# Patient Record
Sex: Male | Born: 1990 | Race: Black or African American | Hispanic: No | Smoking: Never smoker
Health system: Southern US, Community
[De-identification: ages and names within clinical notes are randomized; demographics above are authoritative.]

## PROBLEM LIST (undated history)

## (undated) DIAGNOSIS — I10 Essential (primary) hypertension: Secondary | ICD-10-CM

## (undated) DIAGNOSIS — E119 Type 2 diabetes mellitus without complications: Secondary | ICD-10-CM

## (undated) DIAGNOSIS — H269 Unspecified cataract: Secondary | ICD-10-CM

## (undated) HISTORY — DX: Unspecified cataract: H26.9

## (undated) HISTORY — PX: TONSILLECTOMY: SUR1361

---

## 2014-06-27 ENCOUNTER — Emergency Department (HOSPITAL_COMMUNITY)
Admission: EM | Admit: 2014-06-27 | Discharge: 2014-06-27 | Disposition: A | Discharge status: Home / Self Care | Payer: BLUE CROSS/BLUE SHIELD | Attending: Emergency Medicine | Admitting: Emergency Medicine

## 2014-06-27 ENCOUNTER — Encounter (HOSPITAL_COMMUNITY): Payer: Self-pay | Admitting: *Deleted

## 2014-06-27 DIAGNOSIS — I10 Essential (primary) hypertension: Secondary | ICD-10-CM | POA: Insufficient documentation

## 2014-06-27 DIAGNOSIS — L02412 Cutaneous abscess of left axilla: Secondary | ICD-10-CM | POA: Insufficient documentation

## 2014-06-27 DIAGNOSIS — L0291 Cutaneous abscess, unspecified: Secondary | ICD-10-CM

## 2014-06-27 DIAGNOSIS — E119 Type 2 diabetes mellitus without complications: Secondary | ICD-10-CM | POA: Insufficient documentation

## 2014-06-27 DIAGNOSIS — L0390 Cellulitis, unspecified: Secondary | ICD-10-CM

## 2014-06-27 DIAGNOSIS — L03112 Cellulitis of left axilla: Secondary | ICD-10-CM | POA: Insufficient documentation

## 2014-06-27 HISTORY — DX: Essential (primary) hypertension: I10

## 2014-06-27 HISTORY — DX: Type 2 diabetes mellitus without complications: E11.9

## 2014-06-27 MED ORDER — HYDROCODONE-ACETAMINOPHEN 5-325 MG PO TABS: 1.0000 | ORAL_TABLET | ORAL | Status: DC | PRN

## 2014-06-27 MED ORDER — HYDROCODONE-ACETAMINOPHEN 5-325 MG PO TABS
2.0000 | ORAL_TABLET | Freq: Once | ORAL | Status: AC
  Administered 2014-06-27: 2 via ORAL
  Filled 2014-06-27: qty 2

## 2014-06-27 MED ORDER — CEPHALEXIN 250 MG PO CAPS
500.0000 mg | ORAL_CAPSULE | Freq: Once | ORAL | Status: AC
  Administered 2014-06-27: 500 mg via ORAL
  Filled 2014-06-27: qty 2

## 2014-06-27 MED ORDER — LIDOCAINE HCL (PF) 1 % IJ SOLN
5.0000 mL | Freq: Once | INTRAMUSCULAR | Status: AC
  Administered 2014-06-27: 5 mL via INTRADERMAL
  Filled 2014-06-27: qty 5

## 2014-06-27 MED ORDER — CEPHALEXIN 500 MG PO CAPS: 500.0000 mg | ORAL_CAPSULE | Freq: Four times a day (QID) | ORAL | Status: DC

## 2014-06-27 NOTE — ED Notes (Signed)
The pt has a lesion under his lt arm since Sunday.  No temp .  It has been draining since saturday

## 2014-06-27 NOTE — ED Provider Notes (Signed)
CSN: [id number]     Arrival date & time 06/27/14  2119 History  This chart was scribed for non-physician practitioner, Emilia BeckKaitlyn Josef Tourigny, PA-C working with Tilden FossaElizabeth Rees, MD by Greggory StallionKayla Andersen, ED scribe. This patient was seen in room TR07C/TR07C and the patient's care was started at 9:50 PM.   Chief Complaint  Patient presents with  . Abscess   The history is provided by the patient. No language interpreter was used.    HPI Comments: Brent Hill is a 24 y.o. male who presents to the Emergency Department complaining of an abscess to his left axilla that started 3 days ago. He reports worsening aching without radiation pain and swelling but states it started draining yellow pus yesterday. Pt has not yet done anything for his symptoms. No aggravating/alleviating factors.   Past Medical History  Diagnosis Date  . Diabetes mellitus without complication   . Hypertension    History reviewed. No pertinent past surgical history. No family history on file. History  Substance Use Topics  . Smoking status: Never Smoker   . Smokeless tobacco: Not on file  . Alcohol Use: No    Review of Systems  Skin:       Abscess  All other systems reviewed and are negative.  Allergies  Review of patient's allergies indicates no known allergies.  Home Medications   Prior to Admission medications   Not on File   BP 168/94 mmHg  Pulse 109  Temp(Src) 98.3 F (36.8 C)  Resp 18  Ht 5\' 11"  (1.803 m)  Wt 367 lb (166.47 kg)  BMI 51.21 kg/m2  SpO2 100%   Physical Exam  Constitutional: He is oriented to person, place, and time. He appears well-developed and well-nourished. No distress.  HENT:  Head: Normocephalic and atraumatic.  Eyes: Conjunctivae and EOM are normal.  Neck: Neck supple. No tracheal deviation present.  Cardiovascular: Normal rate.   Pulmonary/Chest: Effort normal. No respiratory distress.  Abdominal: Soft. He exhibits no distension. There is no tenderness.  Musculoskeletal: Normal  range of motion.  Neurological: He is alert and oriented to person, place, and time.  Skin: Skin is warm and dry.  5 x 3 cm area of induration and tenderness of the left axilla. There is a small opening with minimal amount of purulent drainage noted.   Psychiatric: He has a normal mood and affect. His behavior is normal.  Nursing note and vitals reviewed.  ED Course  Procedures (including critical care time)  INCISION AND DRAINAGE Performed by: Emilia BeckKaitlyn Anaijah Augsburger, PA-C Consent: Verbal consent obtained. Risks and benefits: risks, benefits and alternatives were discussed Type: abscess  Body area: left axilla  Anesthesia: local infiltration  Incision was made with a scalpel.  Local anesthetic: lidocaine 1% without epinephrine  Anesthetic total: 2 ml  Complexity: complex Blunt dissection to break up loculations  Drainage: bloody  Drainage amount: minimal  Packing material: none  Patient tolerance: Patient tolerated the procedure well with no immediate complications.   DIAGNOSTIC STUDIES: Oxygen Saturation is 100% on RA, normal by my interpretation.    COORDINATION OF CARE: 9:53 PM-Will I&D abscess and discharge home with an antibiotic, pain medication, and warm compresses. Pt is agreeable to plan.   Labs Review Labs Reviewed - No data to display  Imaging Review No results found.   EKG Interpretation None      MDM   Final diagnoses:  Abscess and cellulitis    10:48 PM Patient's abscess attempted drainage but no purulent drainage was expressed. Patient  will be discharged with keflex and vicodin. Patient instructed to return with worsening or concerning symptoms. Patient advised to apply warm compresses.   I personally performed the services described in this documentation, which was scribed in my presence. The recorded information has been reviewed and is accurate.  Emilia Beck, PA-C 06/27/14 2251  Tilden Fossa, MD 06/27/14 [phone]

## 2014-06-27 NOTE — ED Notes (Signed)
The pt is  A diabetic

## 2014-06-27 NOTE — Discharge Instructions (Signed)
Take Vicodin as needed for pain. Take keflex as directed until gone. Return to the ED with worsening or concerning symptoms. Refer to attached documents for more information.

## 2015-09-19 ENCOUNTER — Encounter (HOSPITAL_COMMUNITY): Payer: Self-pay | Admitting: Emergency Medicine

## 2015-09-19 ENCOUNTER — Emergency Department (HOSPITAL_COMMUNITY): Payer: BLUE CROSS/BLUE SHIELD

## 2015-09-19 ENCOUNTER — Emergency Department (HOSPITAL_COMMUNITY)
Admission: EM | Admit: 2015-09-19 | Discharge: 2015-09-20 | Disposition: A | Discharge status: Home / Self Care | Payer: BLUE CROSS/BLUE SHIELD | Attending: Emergency Medicine | Admitting: Emergency Medicine

## 2015-09-19 DIAGNOSIS — K5900 Constipation, unspecified: Secondary | ICD-10-CM | POA: Diagnosis not present

## 2015-09-19 DIAGNOSIS — N200 Calculus of kidney: Secondary | ICD-10-CM | POA: Diagnosis not present

## 2015-09-19 DIAGNOSIS — N390 Urinary tract infection, site not specified: Secondary | ICD-10-CM

## 2015-09-19 DIAGNOSIS — E1165 Type 2 diabetes mellitus with hyperglycemia: Secondary | ICD-10-CM | POA: Insufficient documentation

## 2015-09-19 DIAGNOSIS — I10 Essential (primary) hypertension: Secondary | ICD-10-CM | POA: Diagnosis not present

## 2015-09-19 DIAGNOSIS — R739 Hyperglycemia, unspecified: Secondary | ICD-10-CM

## 2015-09-19 DIAGNOSIS — R1030 Lower abdominal pain, unspecified: Secondary | ICD-10-CM | POA: Diagnosis present

## 2015-09-19 LAB — CBC
HEMATOCRIT: 42.5 % (ref 39.0–52.0)
Hemoglobin: 14.8 g/dL (ref 13.0–17.0)
MCH: 28.6 pg (ref 26.0–34.0)
MCHC: 34.8 g/dL (ref 30.0–36.0)
MCV: 82 fL (ref 78.0–100.0)
PLATELETS: 224 10*3/uL (ref 150–400)
RBC: 5.18 MIL/uL (ref 4.22–5.81)
RDW: 12.9 % (ref 11.5–15.5)
WBC: 12 10*3/uL — ABNORMAL HIGH (ref 4.0–10.5)

## 2015-09-19 LAB — COMPREHENSIVE METABOLIC PANEL
ALT: 19 U/L (ref 17–63)
AST: 18 U/L (ref 15–41)
Albumin: 3.8 g/dL (ref 3.5–5.0)
Alkaline Phosphatase: 89 U/L (ref 38–126)
Anion gap: 15 (ref 5–15)
BUN: 9 mg/dL (ref 6–20)
CHLORIDE: 93 mmol/L — AB (ref 101–111)
CO2: 25 mmol/L (ref 22–32)
Calcium: 9.8 mg/dL (ref 8.9–10.3)
Creatinine, Ser: 0.89 mg/dL (ref 0.61–1.24)
GFR calc Af Amer: >60 mL/min (ref >60)
GFR calc non Af Amer: >60 mL/min (ref >60)
Glucose, Bld: 523 mg/dL — ABNORMAL HIGH (ref 65–99)
POTASSIUM: 4.2 mmol/L (ref 3.5–5.1)
SODIUM: 133 mmol/L — AB (ref 135–145)
Total Bilirubin: 1 mg/dL (ref 0.3–1.2)
Total Protein: 7.5 g/dL (ref 6.5–8.1)

## 2015-09-19 LAB — URINALYSIS, ROUTINE W REFLEX MICROSCOPIC
Bilirubin Urine: NEGATIVE
Ketones, ur: 40 mg/dL — AB
Nitrite: NEGATIVE
PH: 6 (ref 5.0–8.0)
Protein, ur: NEGATIVE mg/dL
Specific Gravity, Urine: 1.028 (ref 1.005–1.030)

## 2015-09-19 LAB — LIPASE, BLOOD: LIPASE: 55 U/L — AB (ref 11–51)

## 2015-09-19 LAB — URINE MICROSCOPIC-ADD ON

## 2015-09-19 MED ORDER — ONDANSETRON 4 MG PO TBDP
4.0000 mg | ORAL_TABLET | Freq: Once | ORAL | Status: AC | PRN
Start: 2015-09-19 — End: 2015-09-19
  Administered 2015-09-19: 4 mg via ORAL

## 2015-09-19 MED ORDER — TAMSULOSIN HCL 0.4 MG PO CAPS: 0.4000 mg | ORAL_CAPSULE | Freq: Every day | ORAL | Status: AC

## 2015-09-19 MED ORDER — SODIUM CHLORIDE 0.9 % IV BOLUS (SEPSIS)
1000.0000 mL | Freq: Once | INTRAVENOUS | Status: AC
  Administered 2015-09-19: 1000 mL via INTRAVENOUS

## 2015-09-19 MED ORDER — SODIUM CHLORIDE 0.9 % IV BOLUS (SEPSIS)
2000.0000 mL | Freq: Once | INTRAVENOUS | Status: AC
  Administered 2015-09-19: 2000 mL via INTRAVENOUS

## 2015-09-19 MED ORDER — IOPAMIDOL (ISOVUE-300) INJECTION 61%
100.0000 mL | Freq: Once | INTRAVENOUS | Status: AC | PRN
  Administered 2015-09-19: 100 mL via INTRAVENOUS

## 2015-09-19 MED ORDER — OXYCODONE-ACETAMINOPHEN 5-325 MG PO TABS: 1.0000 | ORAL_TABLET | ORAL | Status: DC | PRN

## 2015-09-19 MED ORDER — DEXTROSE 5 % IV SOLN
1.0000 g | Freq: Once | INTRAVENOUS | Status: AC
  Administered 2015-09-19: 1 g via INTRAVENOUS
  Filled 2015-09-19: qty 10

## 2015-09-19 MED ORDER — IBUPROFEN 600 MG PO TABS: 600.0000 mg | ORAL_TABLET | Freq: Four times a day (QID) | ORAL | Status: DC | PRN

## 2015-09-19 MED ORDER — MORPHINE SULFATE (PF) 4 MG/ML IV SOLN
4.0000 mg | Freq: Once | INTRAVENOUS | Status: AC
  Administered 2015-09-19: 4 mg via INTRAVENOUS
  Filled 2015-09-19: qty 1

## 2015-09-19 MED ORDER — ONDANSETRON 4 MG PO TBDP
ORAL_TABLET | ORAL | Status: AC
  Filled 2015-09-19: qty 1

## 2015-09-19 MED ORDER — KETOROLAC TROMETHAMINE 30 MG/ML IJ SOLN
30.0000 mg | Freq: Once | INTRAMUSCULAR | Status: AC
  Administered 2015-09-19: 30 mg via INTRAVENOUS
  Filled 2015-09-19: qty 1

## 2015-09-19 MED ORDER — ONDANSETRON HCL 4 MG PO TABS: 4.0000 mg | ORAL_TABLET | Freq: Three times a day (TID) | ORAL | Status: DC | PRN

## 2015-09-19 MED ORDER — INSULIN ASPART 100 UNIT/ML ~~LOC~~ SOLN
10.0000 [IU] | Freq: Once | SUBCUTANEOUS | Status: AC
  Administered 2015-09-19: 10 [IU] via INTRAVENOUS
  Filled 2015-09-19: qty 1

## 2015-09-19 MED ORDER — CEPHALEXIN 500 MG PO CAPS: 500.0000 mg | ORAL_CAPSULE | Freq: Two times a day (BID) | ORAL | Status: DC

## 2015-09-19 NOTE — ED Notes (Signed)
CBG 369. MD notified.

## 2015-09-19 NOTE — ED Notes (Signed)
Patient transported to CT 

## 2015-09-19 NOTE — ED Provider Notes (Signed)
CSN: [id number]     Arrival date & time 09/19/15  1552 History   First MD Initiated Contact with Patient 09/19/15 1856     Chief Complaint  Patient presents with  . Abdominal Pain     (Consider location/radiation/quality/duration/timing/severity/associated sxs/prior Treatment) HPI Patient presents with lower abdominal pain starting on Friday. States it gradually worsened Saturday. Associated with nausea and anorexia. Mild improvement on Sunday and then worsening again today. Worse with movement. States he's had multiple small bowel movements but no obvious blood. No previous abdominal surgery. Pain radiates to the groin on both sides but worse on the left. Denies testicular pain, scrotal swelling or penile discharge. No previously similar symptoms. Past Medical History  Diagnosis Date  . Diabetes mellitus without complication (HCC)   . Hypertension    Past Surgical History  Procedure Laterality Date  . Tonsillectomy     History reviewed. No pertinent family history. Social History  Substance Use Topics  . Smoking status: Never Smoker   . Smokeless tobacco: None  . Alcohol Use: No    Review of Systems  Constitutional: Negative for fever and chills.  Respiratory: Negative for shortness of breath.   Cardiovascular: Negative for chest pain and palpitations.  Gastrointestinal: Positive for nausea, vomiting, abdominal pain and constipation. Negative for diarrhea and blood in stool.  Genitourinary: Positive for flank pain. Negative for dysuria, frequency, hematuria, penile swelling, scrotal swelling, penile pain and testicular pain.  Musculoskeletal: Positive for myalgias and back pain. Negative for neck pain and neck stiffness.  Skin: Negative for rash and wound.  Neurological: Negative for dizziness, weakness, light-headedness, numbness and headaches.  All other systems reviewed and are negative.     Allergies  Review of patient's allergies indicates no known allergies.  Home  Medications   Prior to Admission medications   Medication Sig Start Date End Date Taking? Authorizing Provider  cephALEXin (KEFLEX) 500 MG capsule Take 1 capsule (500 mg total) by mouth 2 (two) times daily. 09/19/15   Loren Racer, MD  ibuprofen (ADVIL,MOTRIN) 600 MG tablet Take 1 tablet (600 mg total) by mouth every 6 (six) hours as needed. 09/19/15   Loren Racer, MD  ondansetron (ZOFRAN) 4 MG tablet Take 1 tablet (4 mg total) by mouth every 8 (eight) hours as needed for nausea or vomiting. 09/19/15   Loren Racer, MD  oxyCODONE-acetaminophen (PERCOCET) 5-325 MG tablet Take 1-2 tablets by mouth every 4 (four) hours as needed for severe pain. 09/19/15   Loren Racer, MD  tamsulosin (FLOMAX) 0.4 MG CAPS capsule Take 1 capsule (0.4 mg total) by mouth daily. 09/19/15   Loren Racer, MD   BP 129/71 mmHg  Pulse 93  Temp(Src) 98.2 F (36.8 C) (Oral)  Resp 16  Ht 5\' 11"  (1.803 m)  Wt 298 lb 3.2 oz (135.263 kg)  BMI 41.61 kg/m2  SpO2 97% Physical Exam  Constitutional: He is oriented to person, place, and time. He appears well-developed and well-nourished. No distress.  HENT:  Head: Normocephalic and atraumatic.  Mouth/Throat: Oropharynx is clear and moist.  Eyes: EOM are normal. Pupils are equal, round, and reactive to light.  Neck: Normal range of motion. Neck supple.  Cardiovascular: Normal rate and regular rhythm.   Pulmonary/Chest: Effort normal and breath sounds normal. No respiratory distress. He has no wheezes. He has no rales. He exhibits no tenderness.  Abdominal: Soft. Bowel sounds are normal. He exhibits no distension and no mass. There is tenderness (Mild diffuse abdominal tenderness but more focally tender in  the left lower quadrant. Questionable rebound.). There is rebound. There is no guarding.  Genitourinary: Penis normal.  No testicular or scrotal masses. Testicles with normal lie. No penal swelling or discharge.  Musculoskeletal: Normal range of motion. He exhibits  tenderness. He exhibits no edema.  Left flank tenderness with percussion. No midline thoracic or lumbar tenderness. No lower extremity swelling or asymmetry.  Neurological: He is alert and oriented to person, place, and time.  Skin: Skin is warm and dry. No rash noted. No erythema.  Psychiatric: He has a normal mood and affect. His behavior is normal.  Nursing note and vitals reviewed.   ED Course  Procedures (including critical care time) Labs Review Labs Reviewed  LIPASE, BLOOD - Abnormal; Notable for the following:    Lipase 55 (*)    All other components within normal limits  COMPREHENSIVE METABOLIC PANEL - Abnormal; Notable for the following:    Sodium 133 (*)    Chloride 93 (*)    Glucose, Bld 523 (*)    All other components within normal limits  CBC - Abnormal; Notable for the following:    WBC 12.0 (*)    All other components within normal limits  URINALYSIS, ROUTINE W REFLEX MICROSCOPIC (NOT AT [facility]) - Abnormal; Notable for the following:    Color, Urine STRAW (*)    Glucose, UA >1000 (*)    Hgb urine dipstick TRACE (*)    Ketones, ur 40 (*)    Leukocytes, UA SMALL (*)    All other components within normal limits  URINE MICROSCOPIC-ADD ON - Abnormal; Notable for the following:    Squamous Epithelial / LPF 0-5 (*)    Bacteria, UA RARE (*)    All other components within normal limits  CBG MONITORING, ED    Imaging Review Ct Abdomen Pelvis W Contrast  09/19/2015  CLINICAL DATA:  Lower abdominal pain, bilateral flank pain for 1 week. Nausea and vomiting. Left lower quadrant pain. EXAM: CT ABDOMEN AND PELVIS WITH CONTRAST TECHNIQUE: Multidetector CT imaging of the abdomen and pelvis was performed using the standard protocol following bolus administration of intravenous contrast. CONTRAST:  ISOVUE-300 IOPAMIDOL (ISOVUE-300) INJECTION 61% COMPARISON:  None. FINDINGS: Lung bases are clear. Diffuse fatty infiltration of the liver. No focal liver lesions identified. The  gallbladder, spleen, pancreas, adrenal glands, abdominal aorta, inferior vena cava, and retroperitoneal lymph nodes are unremarkable. Stomach and small bowel are decompressed. Scattered stool in the colon without abnormal distention. No free air or free fluid in the abdomen. The left renal nephrogram is slightly delayed in comparison to the right. There is mild stranding around the left kidney and ureter. No significant hydronephrosis or hydroureter but there is evidence of wall thickening in the ureter and stranding diffusely around the ureter, likely indicating pyelonephritis. There is a calcification at the location of the distal left ureter which is not definitively within the ureter. This could represent adjacent phlebolith or a distal ureteral stone. The calcification measures 4 mm. Pelvis: The appendix contains a small appendicolith but is otherwise normal. There is no evidence of appendiceal dilatation or inflammation. Bladder wall is not significantly thickened. Prostate gland is not enlarged. No pelvic mass or lymphadenopathy. No destructive bone lesions. IMPRESSION: Delayed nephrogram on the left with stranding around the left ureter and ureteral wall thickening. Possible stone in the distal left ureter. Changes may represent either or combination of distal ureteral stone and/or pyelonephritis. Appendicolith without evidence of appendicitis. Diffuse fatty infiltration of the liver. Electronically  Signed   By: Burman NievesWilliam  Stevens M.D.   On: 09/19/2015 21:16   I have personally reviewed and evaluated these images and lab results as part of my medical decision-making.   EKG Interpretation None      MDM   Final diagnoses:  Renal stone  UTI (lower urinary tract infection)  Hyperglycemia  Discussed CT results with urology on call. Recommends starting antibiotics and follow-up as an outpatient. Patient is very well-appearing.   Patient received IV Rocephin through discharge with by mouth Keflex.  We'll also give pain medication and nausea medication as well as Flomax. Patient understands need to follow-up with urology. Return precautions have been given the patient has voiced understanding.    Loren Raceravid Bryanah Sidell, MD 09/19/15 2337

## 2015-09-19 NOTE — Discharge Instructions (Signed)
Kidney Stones Kidney stones (urolithiasis) are deposits that form inside your kidneys. The intense pain is caused by the stone moving through the urinary tract. When the stone moves, the ureter goes into spasm around the stone. The stone is usually passed in the urine.  CAUSES   A disorder that makes certain neck glands produce too much parathyroid hormone (primary hyperparathyroidism).  A buildup of uric acid crystals, similar to gout in your joints.  Narrowing (stricture) of the ureter.  A kidney obstruction present at birth (congenital obstruction).  Previous surgery on the kidney or ureters.  Numerous kidney infections. SYMPTOMS   Feeling sick to your stomach (nauseous).  Throwing up (vomiting).  Blood in the urine (hematuria).  Pain that usually spreads (radiates) to the groin.  Frequency or urgency of urination. DIAGNOSIS   Taking a history and physical exam.  Blood or urine tests.  CT scan.  Occasionally, an examination of the inside of the urinary bladder (cystoscopy) is performed. TREATMENT   Observation.  Increasing your fluid intake.  Extracorporeal shock wave lithotripsy--This is a noninvasive procedure that uses shock waves to break up kidney stones.  Surgery may be needed if you have severe pain or persistent obstruction. There are various surgical procedures. Most of the procedures are performed with the use of small instruments. Only small incisions are needed to accommodate these instruments, so recovery time is minimized. The size, location, and chemical composition are all important variables that will determine the proper choice of action for you. Talk to your health care provider to better understand your situation so that you will minimize the risk of injury to yourself and your kidney.  HOME CARE INSTRUCTIONS   Drink enough water and fluids to keep your urine clear or pale yellow. This will help you to pass the stone or stone fragments.  Strain  all urine through the provided strainer. Keep all particulate matter and stones for your health care provider to see. The stone causing the pain may be as small as a grain of salt. It is very important to use the strainer each and every time you pass your urine. The collection of your stone will allow your health care provider to analyze it and verify that a stone has actually passed. The stone analysis will often identify what you can do to reduce the incidence of recurrences.  Only take over-the-counter or prescription medicines for pain, discomfort, or fever as directed by your health care provider.  Keep all follow-up visits as told by your health care provider. This is important.  Get follow-up X-rays if required. The absence of pain does not always mean that the stone has passed. It may have only stopped moving. If the urine remains completely obstructed, it can cause loss of kidney function or even complete destruction of the kidney. It is your responsibility to make sure X-rays and follow-ups are completed. Ultrasounds of the kidney can show blockages and the status of the kidney. Ultrasounds are not associated with any radiation and can be performed easily in a matter of minutes.  Make changes to your daily diet as told by your health care provider. You may be told to:  Limit the amount of salt that you eat.  Eat 5 or more servings of fruits and vegetables each day.  Limit the amount of meat, poultry, fish, and eggs that you eat.  Collect a 24-hour urine sample as told by your health care provider.You may need to collect another urine sample every 6-12  months. SEEK MEDICAL CARE IF:  You experience pain that is progressive and unresponsive to any pain medicine you have been prescribed. SEEK IMMEDIATE MEDICAL CARE IF:   Pain cannot be controlled with the prescribed medicine.  You have a fever or shaking chills.  The severity or intensity of pain increases over 18 hours and is not  relieved by pain medicine.  You develop a new onset of abdominal pain.  You feel faint or pass out.  You are unable to urinate.   This information is not intended to replace advice given to you by your health care provider. Make sure you discuss any questions you have with your health care provider.   Document Released: 05/07/2005 Document Revised: 01/26/2015 Document Reviewed: 10/08/2012 Elsevier Interactive Patient Education 2016 [employer]  Hyperglycemia Hyperglycemia occurs when the glucose (sugar) in your blood is too high. Hyperglycemia can happen for many reasons, but it most often happens to people who do not know they have diabetes or are not managing their diabetes properly.  CAUSES  Whether you have diabetes or not, there are other causes of hyperglycemia. Hyperglycemia can occur when you have diabetes, but it can also occur in other situations that you might not be as aware of, such as: Diabetes  If you have diabetes and are having problems controlling your blood glucose, hyperglycemia could occur because of some of the following reasons:  Not following your meal plan.  Not taking your diabetes medications or not taking it properly.  Exercising less or doing less activity than you normally do.  Being sick. Pre-diabetes  This cannot be ignored. Before people develop Type 2 diabetes, they almost always have "pre-diabetes." This is when your blood glucose levels are higher than normal, but not yet high enough to be diagnosed as diabetes. Research has shown that some long-term damage to the body, especially the heart and circulatory system, may already be occurring during pre-diabetes. If you take action to manage your blood glucose when you have pre-diabetes, you may delay or prevent Type 2 diabetes from developing. Stress  If you have diabetes, you may be "diet" controlled or on oral medications or insulin to control your diabetes. However, you may find that your blood  glucose is higher than usual in the hospital whether you have diabetes or not. This is often referred to as "stress hyperglycemia." Stress can elevate your blood glucose. This happens because of hormones put out by the body during times of stress. If stress has been the cause of your high blood glucose, it can be followed regularly by your caregiver. That way he/she can make sure your hyperglycemia does not continue to get worse or progress to diabetes. Steroids  Steroids are medications that act on the infection fighting system (immune system) to block inflammation or infection. One side effect can be a rise in blood glucose. Most people can produce enough extra insulin to allow for this rise, but for those who cannot, steroids make blood glucose levels go even higher. It is not unusual for steroid treatments to "uncover" diabetes that is developing. It is not always possible to determine if the hyperglycemia will go away after the steroids are stopped. A special blood test called an A1c is sometimes done to determine if your blood glucose was elevated before the steroids were started. SYMPTOMS  Thirsty.  Frequent urination.  Dry mouth.  Blurred vision.  Tired or fatigue.  Weakness.  Sleepy.  Tingling in feet or leg. DIAGNOSIS  Diagnosis is  made by monitoring blood glucose in one or all of the following ways:  A1c test. This is a chemical found in your blood.  Fingerstick blood glucose monitoring.  Laboratory results. TREATMENT  First, knowing the cause of the hyperglycemia is important before the hyperglycemia can be treated. Treatment may include, but is not be limited to:  Education.  Change or adjustment in medications.  Change or adjustment in meal plan.  Treatment for an illness, infection, etc.  More frequent blood glucose monitoring.  Change in exercise plan.  Decreasing or stopping steroids.  Lifestyle changes. HOME CARE INSTRUCTIONS   Test your blood  glucose as directed.  Exercise regularly. Your caregiver will give you instructions about exercise. Pre-diabetes or diabetes which comes on with stress is helped by exercising.  Eat wholesome, balanced meals. Eat often and at regular, fixed times. Your caregiver or nutritionist will give you a meal plan to guide your sugar intake.  Being at an ideal weight is important. If needed, losing as little as 10 to 15 pounds may help improve blood glucose levels. SEEK MEDICAL CARE IF:   You have questions about medicine, activity, or diet.  You continue to have symptoms (problems such as increased thirst, urination, or weight gain). SEEK IMMEDIATE MEDICAL CARE IF:   You are vomiting or have diarrhea.  Your breath smells fruity.  You are breathing faster or slower.  You are very sleepy or incoherent.  You have numbness, tingling, or pain in your feet or hands.  You have chest pain.  Your symptoms get worse even though you have been following your caregiver's orders.  If you have any other questions or concerns.   This information is not intended to replace advice given to you by your health care provider. Make sure you discuss any questions you have with your health care provider.   Document Released: 10/31/2000 Document Revised: 07/30/2011 Document Reviewed: 01/11/2015 Elsevier Interactive Patient Education 2016 [employer] Inc[employer]  Urinary Tract Infection Urinary tract infections (UTIs) can develop anywhere along your urinary tract. Your urinary tract is your body's drainage system for removing wastes and extra water. Your urinary tract includes two kidneys, two ureters, a bladder, and a urethra. Your kidneys are a pair of bean-shaped organs. Each kidney is about the size of your fist. They are located below your ribs, one on each side of your spine. CAUSES Infections are caused by microbes, which are microscopic organisms, including fungi, viruses, and bacteria. These organisms are so small  that they can only be seen through a microscope. Bacteria are the microbes that most commonly cause UTIs. SYMPTOMS  Symptoms of UTIs may vary by age and gender of the patient and by the location of the infection. Symptoms in young women typically include a frequent and intense urge to urinate and a painful, burning feeling in the bladder or urethra during urination. Older women and men are more likely to be tired, shaky, and weak and have muscle aches and abdominal pain. A fever may mean the infection is in your kidneys. Other symptoms of a kidney infection include pain in your back or sides below the ribs, nausea, and vomiting. DIAGNOSIS To diagnose a UTI, your caregiver will ask you about your symptoms. Your caregiver will also ask you to provide a urine sample. The urine sample will be tested for bacteria and white blood cells. White blood cells are made by your body to help fight infection. TREATMENT  Typically, UTIs can be treated with medication. Because most  UTIs are caused by a bacterial infection, they usually can be treated with the use of antibiotics. The choice of antibiotic and length of treatment depend on your symptoms and the type of bacteria causing your infection. HOME CARE INSTRUCTIONS  If you were prescribed antibiotics, take them exactly as your caregiver instructs you. Finish the medication even if you feel better after you have only taken some of the medication.  Drink enough water and fluids to keep your urine clear or pale yellow.  Avoid caffeine, tea, and carbonated beverages. They tend to irritate your bladder.  Empty your bladder often. Avoid holding urine for long periods of time.  Empty your bladder before and after sexual intercourse.  After a bowel movement, women should cleanse from front to back. Use each tissue only once. SEEK MEDICAL CARE IF:   You have back pain.  You develop a fever.  Your symptoms do not begin to resolve within 3 days. SEEK IMMEDIATE  MEDICAL CARE IF:   You have severe back pain or lower abdominal pain.  You develop chills.  You have nausea or vomiting.  You have continued burning or discomfort with urination. MAKE SURE YOU:   Understand these instructions.  Will watch your condition.  Will get help right away if you are not doing well or get worse.   This information is not intended to replace advice given to you by your health care provider. Make sure you discuss any questions you have with your health care provider.   Document Released: 02/14/2005 Document Revised: 01/26/2015 Document Reviewed: 06/15/2011 Elsevier Interactive Patient Education [employer]

## 2015-09-19 NOTE — ED Notes (Signed)
Pt presents to ED with lower abdominal pain worsening since Friday. Last BM today. Pt also reports decreased appetite. Pt denies fever. Pt endorsing nausea and 1 episode of emesis.

## 2015-09-20 LAB — CBG MONITORING, ED: Glucose-Capillary: 369 mg/dL — ABNORMAL HIGH (ref 65–99)

## 2016-12-03 ENCOUNTER — Encounter: Payer: Self-pay | Admitting: Family Medicine

## 2016-12-03 ENCOUNTER — Encounter (INDEPENDENT_AMBULATORY_CARE_PROVIDER_SITE_OTHER): Payer: Self-pay

## 2016-12-03 ENCOUNTER — Ambulatory Visit (INDEPENDENT_AMBULATORY_CARE_PROVIDER_SITE_OTHER): Payer: Self-pay | Admitting: Family Medicine

## 2016-12-03 VITALS — BP 137/91 | HR 99 | Temp 98.5°F | Ht 69.0 in | Wt 293.0 lb

## 2016-12-03 DIAGNOSIS — Z23 Encounter for immunization: Secondary | ICD-10-CM

## 2016-12-03 DIAGNOSIS — I10 Essential (primary) hypertension: Secondary | ICD-10-CM | POA: Insufficient documentation

## 2016-12-03 DIAGNOSIS — N468 Other male infertility: Secondary | ICD-10-CM

## 2016-12-03 DIAGNOSIS — R7309 Other abnormal glucose: Secondary | ICD-10-CM

## 2016-12-03 DIAGNOSIS — E119 Type 2 diabetes mellitus without complications: Secondary | ICD-10-CM | POA: Insufficient documentation

## 2016-12-03 LAB — POCT URINALYSIS DIP (DEVICE)
Bilirubin Urine: NEGATIVE
Hgb urine dipstick: NEGATIVE
KETONES UR: NEGATIVE mg/dL
Leukocytes, UA: NEGATIVE
Nitrite: NEGATIVE
PROTEIN: NEGATIVE mg/dL
Specific Gravity, Urine: 1.015 (ref 1.005–1.030)
UROBILINOGEN UA: 1 mg/dL (ref 0.0–1.0)
pH: 7 (ref 5.0–8.0)

## 2016-12-03 LAB — CBC WITH DIFFERENTIAL/PLATELET
Basophils Absolute: 0 cells/uL (ref 0–200)
Basophils Relative: 0 %
Eosinophils Absolute: 164 cells/uL (ref 15–500)
Eosinophils Relative: 2 %
HEMATOCRIT: 46.9 % (ref 38.5–50.0)
HEMOGLOBIN: 15.7 g/dL (ref 13.2–17.1)
LYMPHS PCT: 30 %
Lymphs Abs: 2460 cells/uL (ref 850–3900)
MCH: 28.8 pg (ref 27.0–33.0)
MCHC: 33.5 g/dL (ref 32.0–36.0)
MCV: 85.9 fL (ref 80.0–100.0)
MONO ABS: 492 {cells}/uL (ref 200–950)
MPV: 11.4 fL (ref 7.5–12.5)
Monocytes Relative: 6 %
NEUTROS PCT: 62 %
Neutro Abs: 5084 cells/uL (ref 1500–7800)
Platelets: 235 10*3/uL (ref 140–400)
RBC: 5.46 MIL/uL (ref 4.20–5.80)
RDW: 14 % (ref 11.0–15.0)
WBC: 8.2 10*3/uL (ref 3.8–10.8)

## 2016-12-03 LAB — POCT GLYCOSYLATED HEMOGLOBIN (HGB A1C): Hemoglobin A1C: 14.4

## 2016-12-03 LAB — GLUCOSE, CAPILLARY: Glucose-Capillary: 389 mg/dL — ABNORMAL HIGH (ref 65–99)

## 2016-12-03 MED ORDER — "INSULIN SYRINGE-NEEDLE U-100 29G X 1/2"" 0.3 ML MISC": 1.0000 | Freq: Every day | 12 refills | Status: AC

## 2016-12-03 MED ORDER — GLUCOSE BLOOD VI STRP: ORAL_STRIP | 12 refills | Status: AC

## 2016-12-03 MED ORDER — ACCU-CHEK SOFT TOUCH LANCETS MISC: 12 refills | Status: AC

## 2016-12-03 MED ORDER — ACCU-CHEK AVIVA DEVI: 0 refills | Status: AC

## 2016-12-03 MED ORDER — METFORMIN HCL 500 MG PO TABS: 500.0000 mg | ORAL_TABLET | Freq: Two times a day (BID) | ORAL | 5 refills | Status: AC

## 2016-12-03 MED ORDER — INSULIN GLARGINE 100 UNIT/ML SOLOSTAR PEN: 20.0000 [IU] | PEN_INJECTOR | Freq: Every day | SUBCUTANEOUS | 99 refills | Status: AC

## 2016-12-03 MED ORDER — LOSARTAN POTASSIUM 25 MG PO TABS: 25.0000 mg | ORAL_TABLET | Freq: Every day | ORAL | 5 refills | Status: AC

## 2016-12-03 NOTE — Progress Notes (Signed)
Subjective:    Patient ID: Brent Hill, male    DOB: 10-Jul-1990, 26 y.o.   MRN: [id number]  HPI Mr. Brent Hill, a 26 year old male presents accompanied by wife to establish care. He says that he was a patient of Dr. Helene Kelp, but was lost to follow up due to insurance constraints. Patient reports a history of prediabetes. His blood sugar was checked by a family member recently and it was greater than 300 at the time. Symptoms include polydipsia, polyuria and visual disturbances. Symptoms have gradually worsened. Patient denies foot ulcerations, vomitting and weight loss.   He is not taking any medications. He does not exercise or follow a low sodium diet. He has a family history of hypertension and diabetes. He is not up to date on immunization.  He and spouse have been trying to conceive for greater than 6 months. He is inquiring about male infertility. He denies erectile dysfunction, dysuria, or premature ejaculation.   Past Medical History:  Diagnosis Date  . Cataract   . Diabetes mellitus without complication (HCC)   . Hypertension    Social History   Social History  . Marital status: Significant Other    Spouse name: N/A  . Number of children: N/A  . Years of education: N/A   Occupational History  . Not on file.   Social History Main Topics  . Smoking status: Never Smoker  . Smokeless tobacco: Never Used  . Alcohol use No  . Drug use: No  . Sexual activity: Not on file   Other Topics Concern  . Not on file   Social History Narrative  . No narrative on file   Immunization History  Administered Date(s) Administered  . Tdap 12/03/2016   Review of Systems  Constitutional: Positive for fatigue.  HENT: Negative.   Eyes: Negative.   Respiratory: Negative.   Cardiovascular: Negative.   Gastrointestinal: Negative.  Negative for constipation and diarrhea.  Endocrine: Positive for polydipsia and polyuria.  Genitourinary: Negative.        Patient says that he and  spouse have been trying to conceive for greater than 6 months  Musculoskeletal: Negative.   Skin: Negative.   Allergic/Immunologic: Negative.  Negative for immunocompromised state.  Neurological: Negative.   Hematological: Negative.   Psychiatric/Behavioral: Negative.        Objective:   Physical Exam  Constitutional: He appears well-developed and well-nourished.  Morbid obesity  HENT:  Head: Normocephalic and atraumatic.  Right Ear: External ear normal.  Nose: Nose normal.  Mouth/Throat: Oropharynx is clear and moist.  Eyes: Pupils are equal, round, and reactive to light. Conjunctivae and EOM are normal.  Neck: Normal range of motion. Neck supple.  Cardiovascular: Normal rate, regular rhythm, normal heart sounds and intact distal pulses.   Pulmonary/Chest: Effort normal and breath sounds normal.  Abdominal: Soft. Bowel sounds are normal. There is no tenderness.  Increased abdominal girth  Skin: Skin is warm and dry.  Hyperpigmentation to neck  Psychiatric: He has a normal mood and affect. His behavior is normal. Judgment and thought content normal.      BP (!) 137/91 (BP Location: Right Arm, Patient Position: Sitting, Cuff Size: Large)   Pulse 99   Temp 98.5 F (36.9 C) (Oral)   Ht 5\' 9"  (1.753 m)   Wt 293 lb (132.9 kg)   SpO2 99%   BMI 43.27 kg/m  Assessment & Plan:  1. Type 2 diabetes mellitus without complication, without long-term current use of  insulin (HCC) Hemoglobin a1C is 14.4 and CBG elevated. Will start Lantus 20 units at bedtime. Demonstrated how to administer medication subcutaneously via teach back.  Will also start trial of Metformin 500 mg BID. Will follow up in office in 1 month. Provided written information on new medications.  Recommend a lowfat, low carbohydrate diet divided over 5-6 small meals, increase water intake to 6-8 glasses, and 150 minutes per week of cardiovascular exercise.   Discussed the importance of following a carbohydrate modified  diet. Will also send a referral to diabetes education.  - Insulin Glargine (LANTUS SOLOSTAR) 100 UNIT/ML Solostar Pen; Inject 20 Units into the skin daily at 10 pm.  Dispense: 5 pen; Refill: PRN - Insulin Syringe-Needle U-100 (B-D INS SYR ULTRAFINE .3CC/29G) 29G X 1/2" 0.3 ML MISC; 1 each by Does not apply route daily.  Dispense: 50 each; Refill: 12 - COMPLETE METABOLIC PANEL WITH GFR - Lipid Panel - CBC with Differential - TSH - metFORMIN (GLUCOPHAGE) 500 MG tablet; Take 1 tablet (500 mg total) by mouth 2 (two) times daily with a meal.  Dispense: 60 tablet; Refill: 5 - glucose blood (ACCU-CHEK AVIVA) test strip; Check blood sugar 3 times per day and at bedtime. Use as instructed  Dispense: 100 each; Refill: 12 - Blood Glucose Monitoring Suppl (ACCU-CHEK AVIVA) device; Use as instructed  Dispense: 1 each; Refill: 0 - Lancets (ACCU-CHEK SOFT TOUCH) lancets; Check blood sugar prior to meals and at bedtime. Use as instructed  Dispense: 100 each; Refill: 12  2. Hypertension, unspecified type Will start Losartan 25 mg daily for hypertension. Discussed potential side effects and provided written information.  - losartan (COZAAR) 25 MG tablet; Take 1 tablet (25 mg total) by mouth daily.  Dispense: 30 tablet; Refill: 5  3. Abnormal blood sugar  - HgB A1c  4. 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 Discussed at length. He does not warrant further workup at this time will address at follow up appt.   Greater than 50% of visit spent counseling on new medications and type 2 diabetes mellitus    RTC: 1 month for DMII and hypertension   Nolon NationsLaChina Moore Saanvika Vazques  MSN, FNP-C [facility] [address]  [place], [place] [id number] [phone]

## 2016-12-03 NOTE — Patient Instructions (Addendum)
Type 2 diabetes mellitus:  Hemoglobin a1C is 14.4, goal is < 8.  Will start Lantus 20 units at bedtime.  Will start Metformin 500 mg twice daily with breakfast and dinner.  Recommend a carbohydrate modified diet. Recommend a lowfat, low carbohydrate diet divided over 5-6 small meals, increase water intake to 6-8 glasses, and 150 minutes per week of cardiovascular exercise.   Check blood sugars 3 times per day and at bedtime.  I will send a referral to diabetes education Your A1C goal is less than 7. Your fasting blood sugar  Upon awakening goal is between 110-140.  Your LDL  (bad cholesterol goal is less than 100 Blood pressure goal is <140/90.  Recommend a lowfat, low carbohydrate diet divided over 5-6 small meals, increase water intake to 6-8 glasses, and 150 minutes per week of cardiovascular exercise.   Take your medications as prescribed Make sure that you are familiar with each one of your medications and what they are used to treat.  If you are unsure of medications, please bring to follow up Will send referral for eye exam  Please keep your scheduled follow up appointment.     Hypertension: Will start Losartan 25 mg daily for hypertension   Received Tdap vaccination without complication    Will follow up in 1 month for type 2 diabetes mellitus   Recommend OTC multivitamin  Insulin Glargine injection What is this medicine? INSULIN GLARGINE (IN su lin GLAR geen) is a human-made form of insulin. This drug lowers the amount of sugar in your blood. It is a long-acting insulin that is usually given once a day. This medicine may be used for other purposes; ask your health care provider or pharmacist if you have questions. COMMON BRAND NAME(S): BASAGLAR, Lantus, Lantus SoloStar, Toujeo SoloStar What should I tell my health care provider before I take this medicine? They need to know if you have any of these conditions: -episodes of low blood sugar -kidney disease -liver  disease -an unusual or allergic reaction to insulin, metacresol, other medicines, foods, dyes, or preservatives -pregnant or trying to get pregnant -breast-feeding How should I use this medicine? This medicine is for injection under the skin. Use this medicine at the same time each day. Use exactly as directed. This insulin should never be mixed in the same syringe with other insulins before injection. Do not vigorously shake before use. You will be taught how to use this medicine and how to adjust doses for activities and illness. Do not use more insulin than prescribed. Always check the appearance of your insulin before using it. This medicine should be clear and colorless like water. Do not use it if it is cloudy, thickened, colored, or has solid particles in it. It is important that you put your used needles and syringes in a special sharps container. Do not put them in a trash can. If you do not have a sharps container, call your pharmacist or healthcare provider to get one. Talk to your pediatrician regarding the use of this medicine in children. Special care may be needed. Overdosage: If you think you have taken too much of this medicine contact a poison control center or emergency room at once. NOTE: This medicine is only for you. Do not share this medicine with others. What if I miss a dose? It is important not to miss a dose. Your health care professional or doctor should discuss a plan for missed doses with you. If you do miss a dose, follow  their plan. Do not take double doses. What may interact with this medicine? -other medicines for diabetes Many medications may cause changes in blood sugar, these include: -alcohol containing beverages -antiviral medicines for HIV or AIDS -aspirin and aspirin-like drugs -certain medicines for blood pressure, heart disease, irregular heart beat -chromium -diuretics -male hormones, such as estrogens or progestins, birth control  pills -fenofibrate -gemfibrozil -isoniazid -lanreotide -male hormones or anabolic steroids -MAOIs like Carbex, Eldepryl, Marplan, Nardil, and Parnate -medicines for weight loss -medicines for allergies, asthma, cold, or cough -medicines for depression, anxiety, or psychotic disturbances -niacin -nicotine -NSAIDs, medicines for pain and inflammation, like ibuprofen or naproxen -octreotide -pasireotide -pentamidine -phenytoin -probenecid -quinolone antibiotics such as ciprofloxacin, levofloxacin, ofloxacin -some herbal dietary supplements -steroid medicines such as prednisone or cortisone -sulfamethoxazole; trimethoprim -thyroid hormones Some medications can hide the warning symptoms of low blood sugar (hypoglycemia). You may need to monitor your blood sugar more closely if you are taking one of these medications. These include: -beta-blockers, often used for high blood pressure or heart problems (examples include atenolol, metoprolol, propranolol) -clonidine -guanethidine -reserpine This list may not describe all possible interactions. Give your health care provider a list of all the medicines, herbs, non-prescription drugs, or dietary supplements you use. Also tell them if you smoke, drink alcohol, or use illegal drugs. Some items may interact with your medicine. What should I watch for while using this medicine? Visit your health care professional or doctor for regular checks on your progress. Do not drive, use machinery, or do anything that needs mental alertness until you know how this medicine affects you. Alcohol may interfere with the effect of this medicine. Avoid alcoholic drinks. A test called the HbA1C (A1C) will be monitored. This is a simple blood test. It measures your blood sugar control over the last 2 to 3 months. You will receive this test every 3 to 6 months. Learn how to check your blood sugar. Learn the symptoms of low and high blood sugar and how to manage  them. Always carry a quick-source of sugar with you in case you have symptoms of low blood sugar. Examples include hard sugar candy or glucose tablets. Make sure others know that you can choke if you eat or drink when you develop serious symptoms of low blood sugar, such as seizures or unconsciousness. They must get medical help at once. Tell your doctor or health care professional if you have high blood sugar. You might need to change the dose of your medicine. If you are sick or exercising more than usual, you might need to change the dose of your medicine. Do not skip meals. Ask your doctor or health care professional if you should avoid alcohol. Many nonprescription cough and cold products contain sugar or alcohol. These can affect blood sugar. Make sure that you have the right kind of syringe for the type of insulin you use. Try not to change the brand and type of insulin or syringe unless your health care professional or doctor tells you to. Switching insulin brand or type can cause dangerously high or low blood sugar. Always keep an extra supply of insulin, syringes, and needles on hand. Use a syringe one time only. Throw away syringe and needle in a closed container to prevent accidental needle sticks. Insulin pens and cartridges should never be shared. Even if the needle is changed, sharing may result in passing of viruses like hepatitis or HIV. Wear a medical ID bracelet or chain, and carry a card that  describes your disease and details of your medicine and dosage times. What side effects may I notice from receiving this medicine? Side effects that you should report to your doctor or health care professional as soon as possible: -allergic reactions like skin rash, itching or hives, swelling of the face, lips, or tongue -breathing problems -signs and symptoms of high blood sugar such as dizziness, dry mouth, dry skin, fruity breath, nausea, stomach pain, increased hunger or thirst, increased  urination -signs and symptoms of low blood sugar such as feeling anxious, confusion, dizziness, increased hunger, unusually weak or tired, sweating, shakiness, cold, irritable, headache, blurred vision, fast heartbeat, loss of consciousness Side effects that usually do not require medical attention (report to your doctor or health care professional if they continue or are bothersome): -increase or decrease in fatty tissue under the skin due to overuse of a particular injection site -itching, burning, swelling, or rash at site where injected This list may not describe all possible side effects. Call your doctor for medical advice about side effects. You may report side effects to FDA at 1-800-FDA-1088. Where should I keep my medicine? Keep out of the reach of children. Store unopened vials in a refrigerator between 2 and 8 degrees C (36 and 46 degrees F). Do not freeze or use if the insulin has been frozen. Opened vials (vials currently in use) may be stored in the refrigerator or at room temperature, at approximately 25 degrees C (77 degrees F) or cooler. Keeping your insulin at room temperature decreases the amount of pain during injection. Once opened, your insulin can be used for 28 days. After 28 days, the vial should be thrown away. Store Lantus [employer], mining in a refrigerator between 2 and 8 degrees C (36 and 46 degrees F) or at room temperature below 30 degrees C (86 degrees F). Do not freeze or use if the insulin has been frozen. Once opened, the pens should be kept at room temperature. Do not store in the refrigerator once opened. Once opened, the insulin can be used for 28 days. After 28 days, the Lantus Solostar Pen or Basaglar KwikPen should be thrown away. Store [employer] in a refrigerator between 2 and 8 degrees C (36 and 46 degrees F). Do not freeze or use if the insulin has been frozen. Once opened, the pens should be kept at room temperature below 30 degrees  C (86 degrees F). Do not store in the refrigerator once opened. Once opened, the insulin can be used for 42 days. After 42 days, the [employer] Pen should be thrown away. Protect from light and excessive heat. Throw away any unused medicine after the expiration date or after the specified time for room temperature storage has passed. NOTE: This sheet is a summary. It may not cover all possible information. If you have questions about this medicine, talk to your doctor, pharmacist, or health care provider.  2018 Elsevier/Gold Standard (2016-05-23 10:26:25) Metformin tablets What is this medicine? METFORMIN (met FOR min) is used to treat type 2 diabetes. It helps to control blood sugar. Treatment is combined with diet and exercise. This medicine can be used alone or with other medicines for diabetes. This medicine may be used for other purposes; ask your health care provider or pharmacist if you have questions. COMMON BRAND NAME(S): Glucophage What should I tell my health care provider before I take this medicine? They need to know if you have any of these conditions: -anemia -dehydration -  heart disease -frequently drink alcohol-containing beverages -kidney disease -liver disease -polycystic ovary syndrome -serious infection or injury -vomiting -an unusual or allergic reaction to metformin, other medicines, foods, dyes, or preservatives -pregnant or trying to get pregnant -breast-feeding How should I use this medicine? Take this medicine by mouth with a glass of water. Follow the directions on the prescription label. Take this medicine with food. Take your medicine at regular intervals. Do not take your medicine more often than directed. Do not stop taking except on your doctor's advice. Talk to your pediatrician regarding the use of this medicine in children. While this drug may be prescribed for children as young as 30 years of age for selected conditions, precautions do  apply. Overdosage: If you think you have taken too much of this medicine contact a poison control center or emergency room at once. NOTE: This medicine is only for you. Do not share this medicine with others. What if I miss a dose? If you miss a dose, take it as soon as you can. If it is almost time for your next dose, take only that dose. Do not take double or extra doses. What may interact with this medicine? Do not take this medicine with any of the following medications: -dofetilide -certain contrast medicines given before X-rays, CT scans, MRI, or other procedures This medicine may also interact with the following medications: -acetazolamide -certain antiviral medicines for HIV or AIDS or for hepatitis, like adefovir, dolutegravir, emtricitabine, entecavir, lamivudine, paritaprevir, or tenofovir -cimetidine -cobicistat -crizotinib -dichlorphenamide -digoxin -diuretics -male hormones, like estrogens or progestins and birth control pills -glycopyrrolate -isoniazid -lamotrigine -medicines for blood pressure, heart disease, irregular heart beat -memantine -midodrine -methazolamide -morphine -niacin -phenothiazines like chlorpromazine, mesoridazine, prochlorperazine, thioridazine -phenytoin -procainamide -propantheline -quinidine -quinine -ranitidine -ranolazine -steroid medicines like prednisone or cortisone -stimulant medicines for attention disorders, weight loss, or to stay awake -thyroid medicines -topiramate -trimethoprim -trospium -vancomycin -vandetanib -zonisamide This list may not describe all possible interactions. Give your health care provider a list of all the medicines, herbs, non-prescription drugs, or dietary supplements you use. Also tell them if you smoke, drink alcohol, or use illegal drugs. Some items may interact with your medicine. What should I watch for while using this medicine? Visit your doctor or health care professional for regular checks  on your progress. A test called the HbA1C (A1C) will be monitored. This is a simple blood test. It measures your blood sugar control over the last 2 to 3 months. You will receive this test every 3 to 6 months. Learn how to check your blood sugar. Learn the symptoms of low and high blood sugar and how to manage them. Always carry a quick-source of sugar with you in case you have symptoms of low blood sugar. Examples include hard sugar candy or glucose tablets. Make sure others know that you can choke if you eat or drink when you develop serious symptoms of low blood sugar, such as seizures or unconsciousness. They must get medical help at once. Tell your doctor or health care professional if you have high blood sugar. You might need to change the dose of your medicine. If you are sick or exercising more than usual, you might need to change the dose of your medicine. Do not skip meals. Ask your doctor or health care professional if you should avoid alcohol. Many nonprescription cough and cold products contain sugar or alcohol. These can affect blood sugar. This medicine may cause ovulation in premenopausal women who do not have  regular monthly periods. This may increase your chances of becoming pregnant. You should not take this medicine if you become pregnant or think you may be pregnant. Talk with your doctor or health care professional about your birth control options while taking this medicine. Contact your doctor or health care professional right away if think you are pregnant. If you are going to need surgery, a MRI, CT scan, or other procedure, tell your doctor that you are taking this medicine. You may need to stop taking this medicine before the procedure. Wear a medical ID bracelet or chain, and carry a card that describes your disease and details of your medicine and dosage times. What side effects may I notice from receiving this medicine? Side effects that you should report to your doctor or  health care professional as soon as possible: -allergic reactions like skin rash, itching or hives, swelling of the face, lips, or tongue -breathing problems -feeling faint or lightheaded, falls -muscle aches or pains -signs and symptoms of low blood sugar such as feeling anxious, confusion, dizziness, increased hunger, unusually weak or tired, sweating, shakiness, cold, irritable, headache, blurred vision, fast heartbeat, loss of consciousness -slow or irregular heartbeat -unusual stomach pain or discomfort -unusually tired or weak Side effects that usually do not require medical attention (report to your doctor or health care professional if they continue or are bothersome): -diarrhea -headache -heartburn -metallic taste in mouth -nausea -stomach gas, upset This list may not describe all possible side effects. Call your doctor for medical advice about side effects. You may report side effects to FDA at 1-800-FDA-1088. Where should I keep my medicine? Keep out of the reach of children. Store at room temperature between 15 and 30 degrees C (59 and 86 degrees F). Protect from moisture and light. Throw away any unused medicine after the expiration date. NOTE: This sheet is a summary. It may not cover all possible information. If you have questions about this medicine, talk to your doctor, pharmacist, or health care provider.  2018 Elsevier/Gold Standard (2015-11-16 15:34:19) Losartan tablets What is this medicine? LOSARTAN (loe SAR tan) is used to treat high blood pressure and to reduce the risk of stroke in certain patients. This drug also slows the progression of kidney disease in patients with diabetes. This medicine may be used for other purposes; ask your health care provider or pharmacist if you have questions. COMMON BRAND NAME(S): Cozaar What should I tell my health care provider before I take this medicine? They need to know if you have any of these conditions: -heart  failure -kidney or liver disease -an unusual or allergic reaction to losartan, other medicines, foods, dyes, or preservatives -pregnant or trying to get pregnant -breast-feeding How should I use this medicine? Take this medicine by mouth with a glass of water. Follow the directions on the prescription label. This medicine can be taken with or without food. Take your doses at regular intervals. Do not take your medicine more often than directed. Talk to your pediatrician regarding the use of this medicine in children. Special care may be needed. Overdosage: If you think you have taken too much of this medicine contact a poison control center or emergency room at once. NOTE: This medicine is only for you. Do not share this medicine with others. What if I miss a dose? If you miss a dose, take it as soon as you can. If it is almost time for your next dose, take only that dose. Do not take double  or extra doses. What may interact with this medicine? -blood pressure medicines -diuretics, especially triamterene, spironolactone, or amiloride -fluconazole -NSAIDs, medicines for pain and inflammation, like ibuprofen or naproxen -potassium salts or potassium supplements -rifampin This list may not describe all possible interactions. Give your health care provider a list of all the medicines, herbs, non-prescription drugs, or dietary supplements you use. Also tell them if you smoke, drink alcohol, or use illegal drugs. Some items may interact with your medicine. What should I watch for while using this medicine? Visit your doctor or health care professional for regular checks on your progress. Check your blood pressure as directed. Ask your doctor or health care professional what your blood pressure should be and when you should contact him or her. Call your doctor or health care professional if you notice an irregular or fast heart beat. Women should inform their doctor if they wish to become pregnant or  think they might be pregnant. There is a potential for serious side effects to an unborn child, particularly in the second or third trimester. Talk to your health care professional or pharmacist for more information. You may get drowsy or dizzy. Do not drive, use machinery, or do anything that needs mental alertness until you know how this drug affects you. Do not stand or sit up quickly, especially if you are an older patient. This reduces the risk of dizzy or fainting spells. Alcohol can make you more drowsy and dizzy. Avoid alcoholic drinks. Avoid salt substitutes unless you are told otherwise by your doctor or health care professional. Do not treat yourself for coughs, colds, or pain while you are taking this medicine without asking your doctor or health care professional for advice. Some ingredients may increase your blood pressure. What side effects may I notice from receiving this medicine? Side effects that you should report to your doctor or health care professional as soon as possible: -confusion, dizziness, light headedness or fainting spells -decreased amount of urine passed -difficulty breathing or swallowing, hoarseness, or tightening of the throat -fast or irregular heart beat, palpitations, or chest pain -skin rash, itching -swelling of your face, lips, tongue, hands, or feet Side effects that usually do not require medical attention (report to your doctor or health care professional if they continue or are bothersome): -cough -decreased sexual function or desire -headache -nasal congestion or stuffiness -nausea or stomach pain -sore or cramping muscles This list may not describe all possible side effects. Call your doctor for medical advice about side effects. You may report side effects to FDA at 1-800-FDA-1088. Where should I keep my medicine? Keep out of the reach of children. Store at room temperature between 15 and 30 degrees C (59 and 86 degrees F). Protect from light.  Keep container tightly closed. Throw away any unused medicine after the expiration date. NOTE: This sheet is a summary. It may not cover all possible information. If you have questions about this medicine, talk to your doctor, pharmacist, or health care provider.  2018 Elsevier/Gold Standard (2007-07-18 16:42:18)  Diabetes Mellitus and Food It is important for you to manage your blood sugar (glucose) level. Your blood glucose level can be greatly affected by what you eat. Eating healthier foods in the appropriate amounts throughout the day at about the same time each day will help you control your blood glucose level. It can also help slow or prevent worsening of your diabetes mellitus. Healthy eating may even help you improve the level of your blood pressure and  reach or maintain a healthy weight. General recommendations for healthful eating and cooking habits include:  Eating meals and snacks regularly. Avoid going long periods of time without eating to lose weight.  Eating a diet that consists mainly of plant-based foods, such as fruits, vegetables, nuts, legumes, and whole grains.  Using low-heat cooking methods, such as baking, instead of high-heat cooking methods, such as deep frying.  Work with your dietitian to make sure you understand how to use the Nutrition Facts information on food labels. How can food affect me? Carbohydrates Carbohydrates affect your blood glucose level more than any other type of food. Your dietitian will help you determine how many carbohydrates to eat at each meal and teach you how to count carbohydrates. Counting carbohydrates is important to keep your blood glucose at a healthy level, especially if you are using insulin or taking certain medicines for diabetes mellitus. Alcohol Alcohol can cause sudden decreases in blood glucose (hypoglycemia), especially if you use insulin or take certain medicines for diabetes mellitus. Hypoglycemia can be a life-threatening  condition. Symptoms of hypoglycemia (sleepiness, dizziness, and disorientation) are similar to symptoms of having too much alcohol. If your health care provider has given you approval to drink alcohol, do so in moderation and use the following guidelines:  Women should not have more than one drink per day, and men should not have more than two drinks per day. One drink is equal to: ? 12 oz of beer. ? 5 oz of wine. ? 1 oz of hard liquor.  Do not drink on an empty stomach.  Keep yourself hydrated. Have water, diet soda, or unsweetened iced tea.  Regular soda, juice, and other mixers might contain a lot of carbohydrates and should be counted.  What foods are not recommended? As you make food choices, it is important to remember that all foods are not the same. Some foods have fewer nutrients per serving than other foods, even though they might have the same number of calories or carbohydrates. It is difficult to get your body what it needs when you eat foods with fewer nutrients. Examples of foods that you should avoid that are high in calories and carbohydrates but low in nutrients include:  Trans fats (most processed foods list trans fats on the Nutrition Facts label).  Regular soda.  Juice.  Candy.  Sweets, such as cake, pie, doughnuts, and cookies.  Fried foods.  What foods can I eat? Eat nutrient-rich foods, which will nourish your body and keep you healthy. The food you should eat also will depend on several factors, including:  The calories you need.  The medicines you take.  Your weight.  Your blood glucose level.  Your blood pressure level.  Your cholesterol level.  You should eat a variety of foods, including:  Protein. ? Lean cuts of meat. ? Proteins low in saturated fats, such as fish, egg whites, and beans. Avoid processed meats.  Fruits and vegetables. ? Fruits and vegetables that may help control blood glucose levels, such as apples, mangoes, and  yams.  Dairy products. ? Choose fat-free or low-fat dairy products, such as milk, yogurt, and cheese.  Grains, bread, pasta, and rice. ? Choose whole grain products, such as multigrain bread, whole oats, and brown rice. These foods may help control blood pressure.  Fats. ? Foods containing healthful fats, such as nuts, avocado, olive oil, canola oil, and fish.  Does everyone with diabetes mellitus have the same meal plan? Because every person with  diabetes mellitus is different, there is not one meal plan that works for everyone. It is very important that you meet with a dietitian who will help you create a meal plan that is just right for you. This information is not intended to replace advice given to you by your health care provider. Make sure you discuss any questions you have with your health care provider. Document Released: 02/01/2005 Document Revised: 10/13/2015 Document Reviewed: 04/03/2013 Elsevier Interactive Patient Education  2017 [place].  Diabetes and Foot Care Diabetes may cause you to have problems because of poor blood supply (circulation) to your feet and legs. This may cause the skin on your feet to become thinner, break easier, and heal more slowly. Your skin may become dry, and the skin may peel and crack. You may also have nerve damage in your legs and feet causing decreased feeling in them. You may not notice minor injuries to your feet that could lead to infections or more serious problems. Taking care of your feet is one of the most important things you can do for yourself. Follow these instructions at home:  Wear shoes at all times, even in the house. Do not go barefoot. Bare feet are easily injured.  Check your feet daily for blisters, cuts, and redness. If you cannot see the bottom of your feet, use a mirror or ask someone for help.  Wash your feet with warm water (do not use hot water) and mild soap. Then pat your feet and the areas between your toes until  they are completely dry. Do not soak your feet as this can dry your skin.  Apply a moisturizing lotion or petroleum jelly (that does not contain alcohol and is unscented) to the skin on your feet and to dry, brittle toenails. Do not apply lotion between your toes.  Trim your toenails straight across. Do not dig under them or around the cuticle. File the edges of your nails with an emery board or nail file.  Do not cut corns or calluses or try to remove them with medicine.  Wear clean socks or stockings every day. Make sure they are not too tight. Do not wear knee-high stockings since they may decrease blood flow to your legs.  Wear shoes that fit properly and have enough cushioning. To break in new shoes, wear them for just a few hours a day. This prevents you from injuring your feet. Always look in your shoes before you put them on to be sure there are no objects inside.  Do not cross your legs. This may decrease the blood flow to your feet.  If you find a minor scrape, cut, or break in the skin on your feet, keep it and the skin around it clean and dry. These areas may be cleansed with mild soap and water. Do not cleanse the area with peroxide, alcohol, or iodine.  When you remove an adhesive bandage, be sure not to damage the skin around it.  If you have a wound, look at it several times a day to make sure it is healing.  Do not use heating pads or hot water bottles. They may burn your skin. If you have lost feeling in your feet or legs, you may not know it is happening until it is too late.  Make sure your health care provider performs a complete foot exam at least annually or more often if you have foot problems. Report any cuts, sores, or bruises to your health care  provider immediately. Contact a health care provider if:  You have an injury that is not healing.  You have cuts or breaks in the skin.  You have an ingrown nail.  You notice redness on your legs or feet.  You feel  burning or tingling in your legs or feet.  You have pain or cramps in your legs and feet.  Your legs or feet are numb.  Your feet always feel cold. Get help right away if:  There is increasing redness, swelling, or pain in or around a wound.  There is a red line that goes up your leg.  Pus is coming from a wound.  You develop a fever or as directed by your health care provider.  You notice a bad smell coming from an ulcer or wound. This information is not intended to replace advice given to you by your health care provider. Make sure you discuss any questions you have with your health care provider. Document Released: 05/04/2000 Document Revised: 10/13/2015 Document Reviewed: 10/14/2012 Elsevier Interactive Patient Education  2017 [place].  Diabetes Mellitus and Exercise Exercising regularly is important for your overall health, especially when you have diabetes (diabetes mellitus). Exercising is not only about losing weight. It has many health benefits, such as increasing muscle strength and bone density and reducing body fat and stress. This leads to improved fitness, flexibility, and endurance, all of which result in better overall health. Exercise has additional benefits for people with diabetes, including:  Reducing appetite.  Helping to lower and control blood glucose.  Lowering blood pressure.  Helping to control amounts of fatty substances (lipids) in the blood, such as cholesterol and triglycerides.  Helping the body to respond better to insulin (improving insulin sensitivity).  Reducing how much insulin the body needs.  Decreasing the risk for heart disease by: ? Lowering cholesterol and triglyceride levels. ? Increasing the levels of good cholesterol. ? Lowering blood glucose levels.  What is my activity plan? Your health care provider or certified diabetes educator can help you make a plan for the type and frequency of exercise (activity plan) that works  for you. Make sure that you:  Do at least 150 minutes of moderate-intensity or vigorous-intensity exercise each week. This could be brisk walking, biking, or water aerobics. ? Do stretching and strength exercises, such as yoga or weightlifting, at least 2 times a week. ? Spread out your activity over at least 3 days of the week.  Get some form of physical activity every day. ? Do not go more than 2 days in a row without some kind of physical activity. ? Avoid being inactive for more than 90 minutes at a time. Take frequent breaks to walk or stretch.  Choose a type of exercise or activity that you enjoy, and set realistic goals.  Start slowly, and gradually increase the intensity of your exercise over time.  What do I need to know about managing my diabetes?  Check your blood glucose before and after exercising. ? If your blood glucose is higher than 240 mg/dL (13.3 mmol/L) before you exercise, check your urine for ketones. If you have ketones in your urine, do not exercise until your blood glucose returns to normal.  Know the symptoms of low blood glucose (hypoglycemia) and how to treat it. Your risk for hypoglycemia increases during and after exercise. Common symptoms of hypoglycemia can include: ? Hunger. ? Anxiety. ? Sweating and feeling clammy. ? Confusion. ? Dizziness or feeling light-headed. ? Increased  heart rate or palpitations. ? Blurry vision. ? Tingling or numbness around the mouth, lips, or tongue. ? Tremors or shakes. ? Irritability.  Keep a rapid-acting carbohydrate snack available before, during, and after exercise to help prevent or treat hypoglycemia.  Avoid injecting insulin into areas of the body that are going to be exercised. For example, avoid injecting insulin into: ? The arms, when playing tennis. ? The legs, when jogging.  Keep records of your exercise habits. Doing this can help you and your health care provider adjust your diabetes management plan as  needed. Write down: ? Food that you eat before and after you exercise. ? Blood glucose levels before and after you exercise. ? The type and amount of exercise you have done. ? When your insulin is expected to peak, if you use insulin. Avoid exercising at times when your insulin is peaking.  When you start a new exercise or activity, work with your health care provider to make sure the activity is safe for you, and to adjust your insulin, medicines, or food intake as needed.  Drink plenty of water while you exercise to prevent dehydration or heat stroke. Drink enough fluid to keep your urine clear or pale yellow. This information is not intended to replace advice given to you by your health care provider. Make sure you discuss any questions you have with your health care provider. Document Released: 07/28/2003 Document Revised: 11/25/2015 Document Reviewed: 10/17/2015 Elsevier Interactive Patient Education  2018 [employer].  Carbohydrate Counting for Diabetes Mellitus, Adult Carbohydrate counting is a method for keeping track of how many carbohydrates you eat. Eating carbohydrates naturally increases the amount of sugar (glucose) in the blood. Counting how many carbohydrates you eat helps keep your blood glucose within normal limits, which helps you manage your diabetes (diabetes mellitus). It is important to know how many carbohydrates you can safely have in each meal. This is different for every person. A diet and nutrition specialist (registered dietitian) can help you make a meal plan and calculate how many carbohydrates you should have at each meal and snack. Carbohydrates are found in the following foods:  Grains, such as breads and cereals.  Dried beans and soy products.  Starchy vegetables, such as potatoes, peas, and corn.  Fruit and fruit juices.  Milk and yogurt.  Sweets and snack foods, such as cake, cookies, candy, chips, and soft drinks.  How do I count  carbohydrates? There are two ways to count carbohydrates in food. You can use either of the methods or a combination of both. Reading "Nutrition Facts" on packaged food The "Nutrition Facts" list is included on the labels of almost all packaged foods and beverages in the [place] It includes:  The serving size.  Information about nutrients in each serving, including the grams (g) of carbohydrate per serving.  To use the "Nutrition Facts":  Decide how many servings you will have.  Multiply the number of servings by the number of carbohydrates per serving.  The resulting number is the total amount of carbohydrates that you will be having.  Learning standard serving sizes of other foods When you eat foods containing carbohydrates that are not packaged or do not include "Nutrition Facts" on the label, you need to measure the servings in order to count the amount of carbohydrates:  Measure the foods that you will eat with a food scale or measuring cup, if needed.  Decide how many standard-size servings you will eat.  Multiply the number of servings  by 15. Most carbohydrate-rich foods have about 15 g of carbohydrates per serving. ? For example, if you eat 8 oz (170 g) of strawberries, you will have eaten 2 servings and 30 g of carbohydrates (2 servings x 15 g = 30 g).  For foods that have more than one food mixed, such as soups and casseroles, you must count the carbohydrates in each food that is included.  The following list contains standard serving sizes of common carbohydrate-rich foods. Each of these servings has about 15 g of carbohydrates:   hamburger bun or  English muffin.   oz (15 mL) syrup.   oz (14 g) jelly.  1 slice of bread.  1 six-inch tortilla.  3 oz (85 g) cooked rice or pasta.  4 oz (113 g) cooked dried beans.  4 oz (113 g) starchy vegetable, such as peas, corn, or potatoes.  4 oz (113 g) hot cereal.  4 oz (113 g) mashed potatoes or  of a large baked  potato.  4 oz (113 g) canned or frozen fruit.  4 oz (120 mL) fruit juice.  4-6 crackers.  6 chicken nuggets.  6 oz (170 g) unsweetened dry cereal.  6 oz (170 g) plain fat-free yogurt or yogurt sweetened with artificial sweeteners.  8 oz (240 mL) milk.  8 oz (170 g) fresh fruit or one small piece of fruit.  24 oz (680 g) popped popcorn.  Example of carbohydrate counting Sample meal  3 oz (85 g) chicken breast.  6 oz (170 g) brown rice.  4 oz (113 g) corn.  8 oz (240 mL) milk.  8 oz (170 g) strawberries with sugar-free whipped topping. Carbohydrate calculation 1. Identify the foods that contain carbohydrates: ? Rice. ? Corn. ? Milk. ? Strawberries. 2. Calculate how many servings you have of each food: ? 2 servings rice. ? 1 serving corn. ? 1 serving milk. ? 1 serving strawberries. 3. Multiply each number of servings by 15 g: ? 2 servings rice x 15 g = 30 g. ? 1 serving corn x 15 g = 15 g. ? 1 serving milk x 15 g = 15 g. ? 1 serving strawberries x 15 g = 15 g. 4. Add together all of the amounts to find the total grams of carbohydrates eaten: ? 30 g + 15 g + 15 g + 15 g = 75 g of carbohydrates total. This information is not intended to replace advice given to you by your health care provider. Make sure you discuss any questions you have with your health care provider. Document Released: 05/07/2005 Document Revised: 11/25/2015 Document Reviewed: 10/19/2015 Elsevier Interactive Patient Education  2018 [employer].  Blood Glucose Monitoring, Adult Monitoring your blood sugar (glucose) helps you manage your diabetes. It also helps you and your health care provider determine how well your diabetes management plan is working. Blood glucose monitoring involves checking your blood glucose as often as directed, and keeping a record (log) of your results over time. Why should I monitor my blood glucose? Checking your blood glucose regularly can:  Help you understand how  food, exercise, illnesses, and medicines affect your blood glucose.  Let you know what your blood glucose is at any time. You can quickly tell if you are having low blood glucose (hypoglycemia) or high blood glucose (hyperglycemia).  Help you and your health care provider adjust your medicines as needed.  When should I check my blood glucose? Follow instructions from your health care provider about how  often to check your blood glucose. This may depend on:  The type of diabetes you have.  How well-controlled your diabetes is.  Medicines you are taking.  If you have type 1 diabetes:  Check your blood glucose at least 2 times a day.  Also check your blood glucose: ? Before every insulin injection. ? Before and after exercise. ? Between meals. ? 2 hours after a meal. ? Occasionally between 2:00 a.m. and 3:00 a.m., as directed. ? Before potentially dangerous tasks, like driving or using heavy machinery. ? At bedtime.  You may need to check your blood glucose more often, up to 6-10 times a day: ? If you use an insulin pump. ? If you need multiple daily injections (MDI). ? If your diabetes is not well-controlled. ? If you are ill. ? If you have a history of severe hypoglycemia. ? If you have a history of not knowing when your blood glucose is getting low (hypoglycemia unawareness). If you have type 2 diabetes:  If you take insulin or other diabetes medicines, check your blood glucose at least 2 times a day.  If you are on intensive insulin therapy, check your blood glucose at least 4 times a day. Occasionally, you may also need to check between 2:00 a.m. and 3:00 a.m., as directed.  Also check your blood glucose: ? Before and after exercise. ? Before potentially dangerous tasks, like driving or using heavy machinery.  You may need to check your blood glucose more often if: ? Your medicine is being adjusted. ? Your diabetes is not well-controlled. ? You are ill. What is a  blood glucose log?  A blood glucose log is a record of your blood glucose readings. It helps you and your health care provider: ? Look for patterns in your blood glucose over time. ? Adjust your diabetes management plan as needed.  Every time you check your blood glucose, write down your result and notes about things that may be affecting your blood glucose, such as your diet and exercise for the day.  Most glucose meters store a record of glucose readings in the meter. Some meters allow you to download your records to a computer. How do I check my blood glucose? Follow these steps to get accurate readings of your blood glucose: Supplies needed   Blood glucose meter.  Test strips for your meter. Each meter has its own strips. You must use the strips that come with your meter.  A needle to prick your finger (lancet). Do not use lancets more than once.  A device that holds the lancet (lancing device).  A journal or log book to write down your results. Procedure  Wash your hands with soap and water.  Prick the side of your finger (not the tip) with the lancet. Use a different finger each time.  Gently rub the finger until a small drop of blood appears.  Follow instructions that come with your meter for inserting the test strip, applying blood to the strip, and using your blood glucose meter.  Write down your result and any notes. Alternative testing sites  Some meters allow you to use areas of your body other than your finger (alternative sites) to test your blood.  If you think you may have hypoglycemia, or if you have hypoglycemia unawareness, do not use alternative sites. Use your finger instead.  Alternative sites may not be as accurate as the fingers, because blood flow is slower in these areas. This means that the  result you get may be delayed, and it may be different from the result that you would get from your finger.  The most common alternative sites  are: ? Forearm. ? Thigh. ? Palm of the hand. Additional tips  Always keep your supplies with you.  If you have questions or need help, all blood glucose meters have a 24-hour "hotline" number that you can call. You may also contact your health care provider.  After you use a few boxes of test strips, adjust (calibrate) your blood glucose meter by following instructions that came with your meter. This information is not intended to replace advice given to you by your health care provider. Make sure you discuss any questions you have with your health care provider. Document Released: 05/10/2003 Document Revised: 11/25/2015 Document Reviewed: 10/17/2015 Elsevier Interactive Patient Education  2017 [place].  Hyperglycemia Hyperglycemia is when the sugar (glucose) level in your blood is too high. It may not cause symptoms. If you do have symptoms, they may include warning signs, such as:  Feeling more thirsty than normal.  Hunger.  Feeling tired.  Needing to pee (urinate) more than normal.  Blurry eyesight (vision).  You may get other symptoms as it gets worse, such as:  Dry mouth.  Not being hungry (loss of appetite).  Fruity-smelling breath.  Weakness.  Weight gain or loss that is not planned. Weight loss may be fast.  A tingling or numb feeling in your hands or feet.  Headache.  Skin that does not bounce back quickly when it is lightly pinched and released (poor skin turgor).  Pain in your belly (abdomen).  Cuts or bruises that heal slowly.  High blood sugar can happen to people who do or do not have diabetes. High blood sugar can happen slowly or quickly, and it can be an emergency. Follow these instructions at home: General instructions  Take over-the-counter and prescription medicines only as told by your doctor.  Do not use products that contain nicotine or tobacco, such as cigarettes and e-cigarettes. If you need help quitting, ask your  doctor.  Limit alcohol intake to no more than 1 drink per day for nonpregnant women and 2 drinks per day for men. One drink equals 12 oz of beer, 5 oz of wine, or 1 oz of hard liquor.  Manage stress. If you need help with this, ask your doctor.  Keep all follow-up visits as told by your doctor. This is important. Eating and drinking  Stay at a healthy weight.  Exercise regularly, as told by your doctor.  Drink enough fluid, especially when you: ? Exercise. ? Get sick. ? Are in hot temperatures.  Eat healthy foods, such as: ? Low-fat (lean) proteins. ? Complex carbs (complex carbohydrates), such as whole wheat bread or brown rice. ? Fresh fruits and vegetables. ? Low-fat dairy products. ? Healthy fats.  Drink enough fluid to keep your pee (urine) clear or pale yellow. If you have diabetes:  Make sure you know the symptoms of hyperglycemia.  Follow your diabetes management plan, as told by your doctor. Make sure you: ? Take insulin and medicines as told. ? Follow your exercise plan. ? Follow your meal plan. Eat on time. Do not skip meals. ? Check your blood sugar as often as told. Make sure to check before and after exercise. If you exercise longer or in a different way than you normally do, check your blood sugar more often. ? Follow your sick day plan whenever you cannot  eat or drink normally. Make this plan ahead of time with your doctor.  Share your diabetes management plan with people in your workplace, school, and household.  Check your urine for ketones when you are ill and as told by your doctor.  Carry a card or wear jewelry that says that you have diabetes. Contact a doctor if:  Your blood sugar level is higher than 240 mg/dL (13.3 mmol/L) for 2 days in a row.  You have problems keeping your blood sugar in your target range.  High blood sugar happens often for you. Get help right away if:  You have trouble breathing.  You have a change in how you think,  feel, or act (mental status).  You feel sick to your stomach (nauseous), and that feeling does not go away.  You cannot stop throwing up (vomiting). These symptoms may be an emergency. Do not wait to see if the symptoms will go away. Get medical help right away. Call your local emergency services (911 in the [place] Do not drive yourself to the hospital. Summary  Hyperglycemia is when the sugar (glucose) level in your blood is too high.  High blood sugar can happen to people who do or do not have diabetes.  Make sure you drink enough fluids, eat healthy foods, and exercise regularly.  Contact your doctor if you have problems keeping your blood sugar in your target range. This information is not intended to replace advice given to you by your health care provider. Make sure you discuss any questions you have with your health care provider. Document Released: 03/04/2009 Document Revised: 01/23/2016 Document Reviewed: 01/23/2016 Elsevier Interactive Patient Education  2017 [employer].

## 2016-12-04 ENCOUNTER — Telehealth: Payer: Self-pay

## 2016-12-04 LAB — COMPLETE METABOLIC PANEL WITH GFR
ALT: 24 U/L (ref 9–46)
AST: 15 U/L (ref 10–40)
Albumin: 4.3 g/dL (ref 3.6–5.1)
Alkaline Phosphatase: 88 U/L (ref 40–115)
BILIRUBIN TOTAL: 0.5 mg/dL (ref 0.2–1.2)
BUN: 8 mg/dL (ref 7–25)
CALCIUM: 9.5 mg/dL (ref 8.6–10.3)
CO2: 21 mmol/L (ref 20–31)
Chloride: 99 mmol/L (ref 98–110)
Creat: 0.69 mg/dL (ref 0.60–1.35)
GFR, Est African American: >89 mL/min (ref >=60)
Glucose, Bld: 388 mg/dL — ABNORMAL HIGH (ref 65–99)
POTASSIUM: 4.3 mmol/L (ref 3.5–5.3)
Sodium: 135 mmol/L (ref 135–146)
TOTAL PROTEIN: 7.4 g/dL (ref 6.1–8.1)

## 2016-12-04 LAB — TSH: TSH: 1.39 mIU/L (ref 0.40–4.50)

## 2016-12-04 LAB — LIPID PANEL
CHOL/HDL RATIO: 3.4 ratio (ref <5.0)
CHOLESTEROL: 136 mg/dL (ref <200)
HDL: 40 mg/dL — ABNORMAL LOW (ref >40)
LDL Cholesterol: 55 mg/dL (ref <100)
Triglycerides: 207 mg/dL — ABNORMAL HIGH (ref <150)
VLDL: 41 mg/dL — ABNORMAL HIGH (ref <30)

## 2016-12-04 NOTE — Telephone Encounter (Signed)
Called patient, he states that insurance would not cover lantus. I called and spoke with pharmacist and they have a discount card he can pick up and use. I have called patient back and informed him of this. Thanks!

## 2016-12-06 ENCOUNTER — Telehealth: Payer: Self-pay | Admitting: Family Medicine

## 2016-12-06 NOTE — Telephone Encounter (Signed)
Spoke with patient and reported labs and instructions for follow up. He has not started insulin yet due to insurance issues. He says someone is working on it for him. He will keep follow up appointment.

## 2017-01-04 ENCOUNTER — Ambulatory Visit: Payer: Medicaid Other | Admitting: Family Medicine

## 2017-01-28 ENCOUNTER — Ambulatory Visit: Payer: Medicaid Other | Admitting: Family Medicine

## 2017-02-07 ENCOUNTER — Encounter: Payer: Self-pay | Admitting: Family Medicine

## 2017-05-31 IMAGING — CT CT ABD-PELV W/ CM
2 of 4 series · 16 of 46 positions shown, 18 images · IV contrast (APPLIED)
Comparison: None.

CLINICAL DATA: Lower abdominal pain, bilateral flank pain for 1
week. Nausea and vomiting. Left lower quadrant pain.

EXAM:
CT ABDOMEN AND PELVIS WITH CONTRAST
TECHNIQUE: Multidetector CT imaging of the abdomen and pelvis was performed
using the standard protocol following bolus administration of
intravenous contrast.
CONTRAST:  100mL [id number] IOPAMIDOL ([id number]) INJECTION 61%

[Series 2: abd/ pelvis 5.0 i30f 1 · axial · 0.97mm/px · z∈[+859,+1339]mm · 13 of 106 slices shown, 15 images]
[im 5/106  soft-tissue]
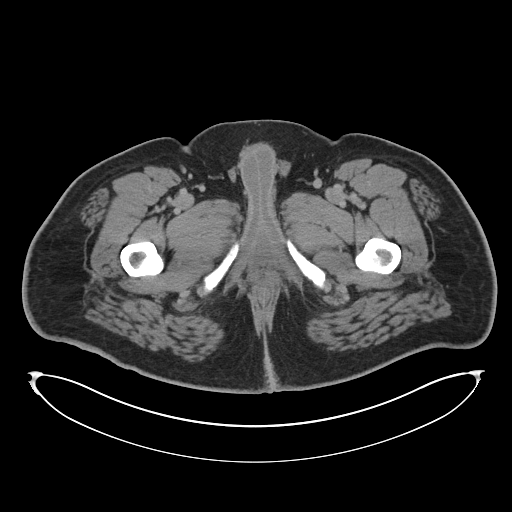
[im 5/106  bone]
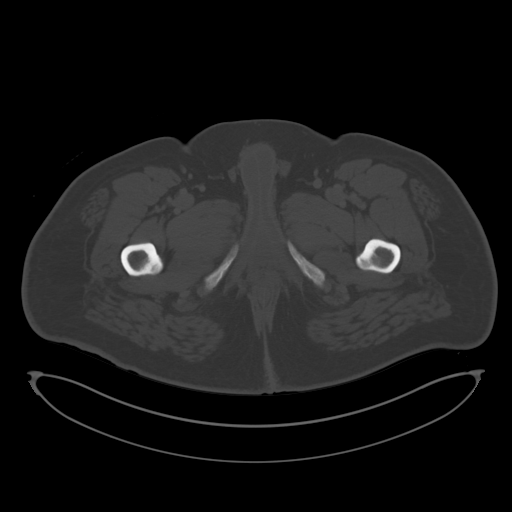
[im 13/106  soft-tissue]
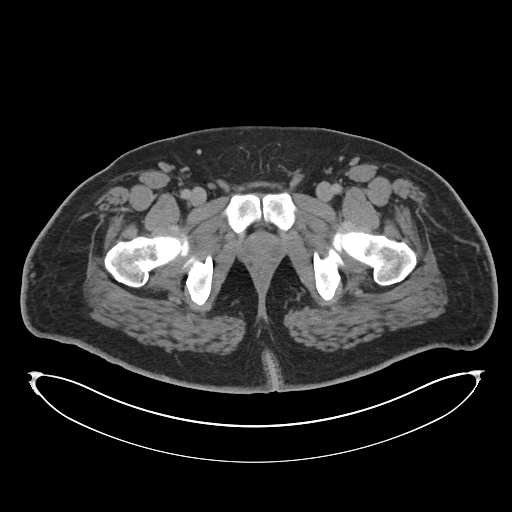
[im 21/106  soft-tissue]
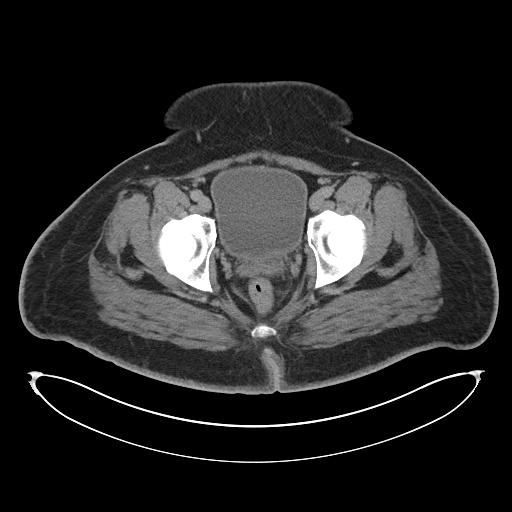
[im 29/106  soft-tissue]
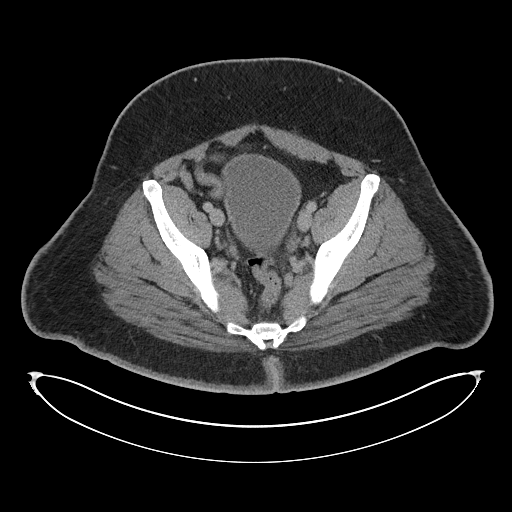
[im 37/106  soft-tissue]
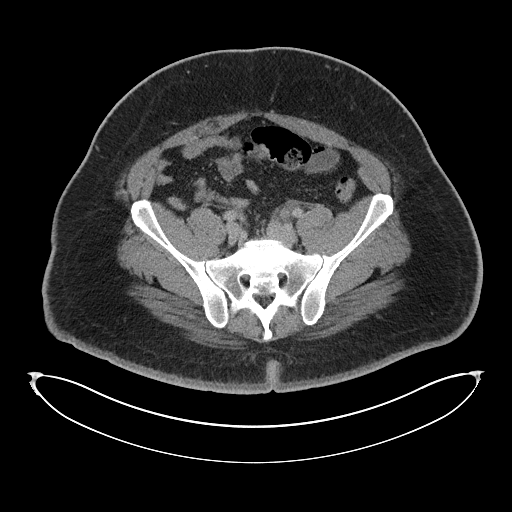
[im 45/106  soft-tissue]
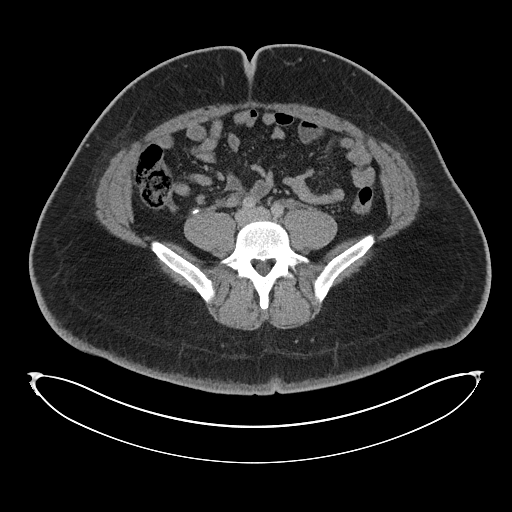
[im 53/106  soft-tissue]
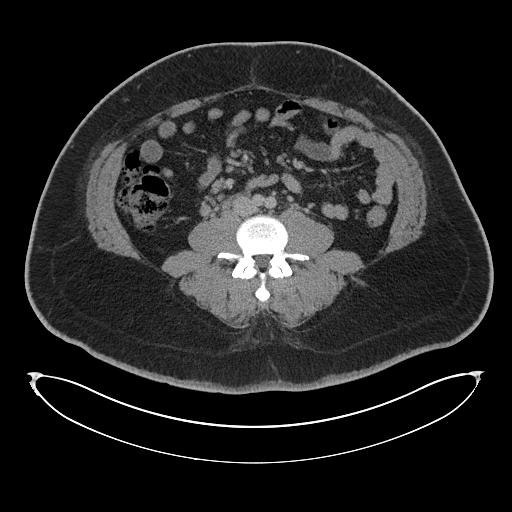
[im 61/106  soft-tissue]
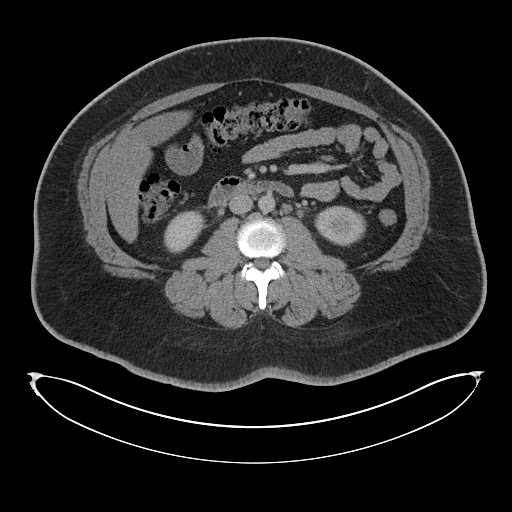
[im 69/106  soft-tissue]
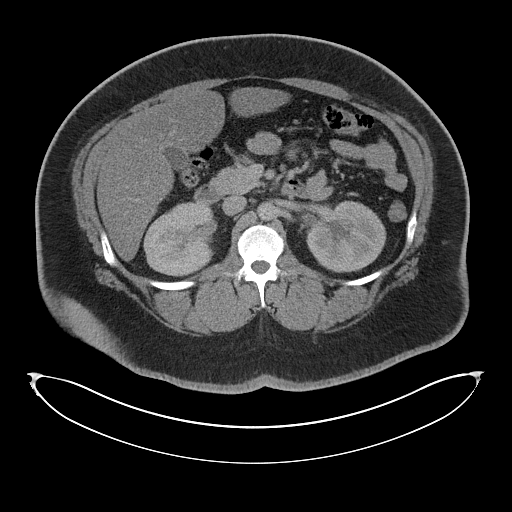
[im 69/106  bone]
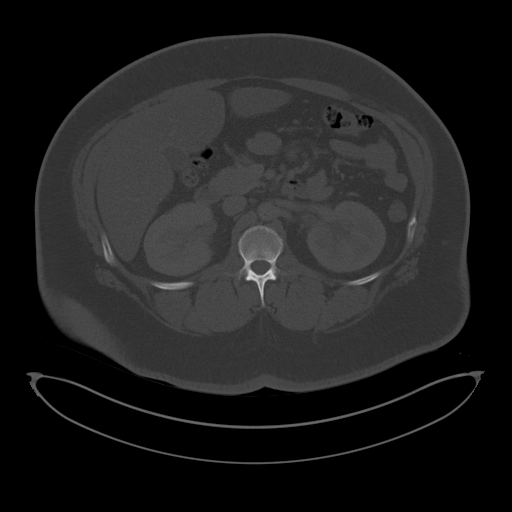
[im 77/106  soft-tissue]
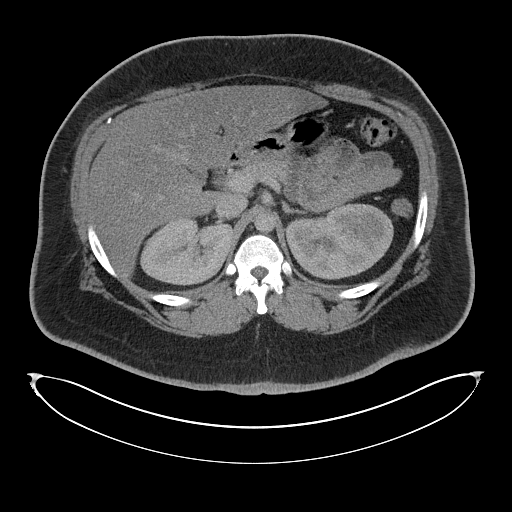
[im 85/106  soft-tissue]
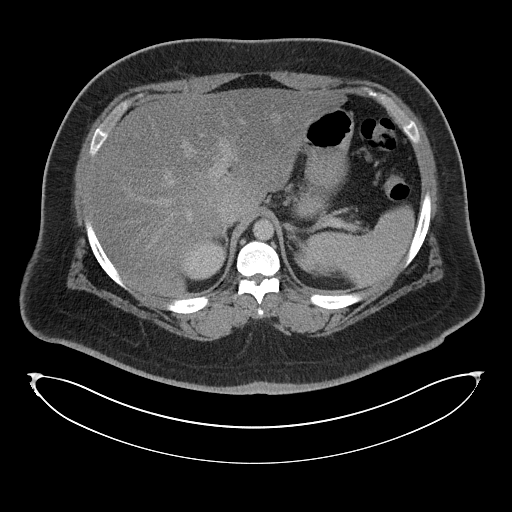
[im 93/106  soft-tissue]
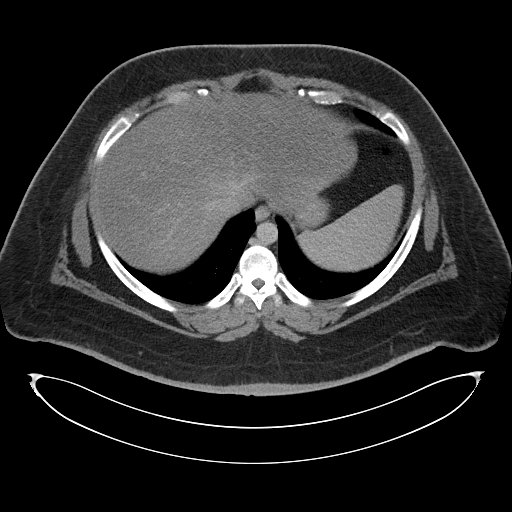
[im 101/106  soft-tissue]
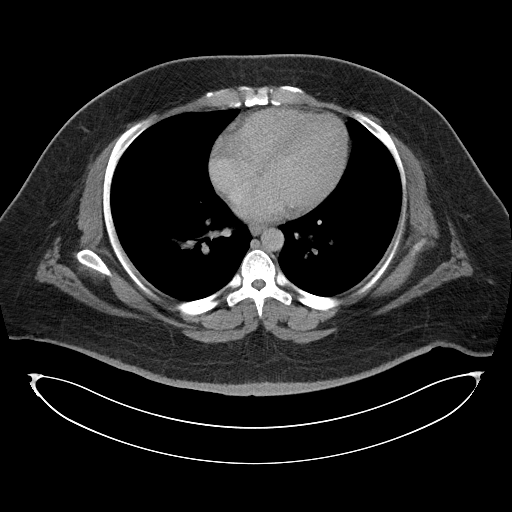

[Series 5: coronal soft tissue · coronal · 0.94mm/px · 3 of 108 slices shown]
[im 36/108  soft-tissue]
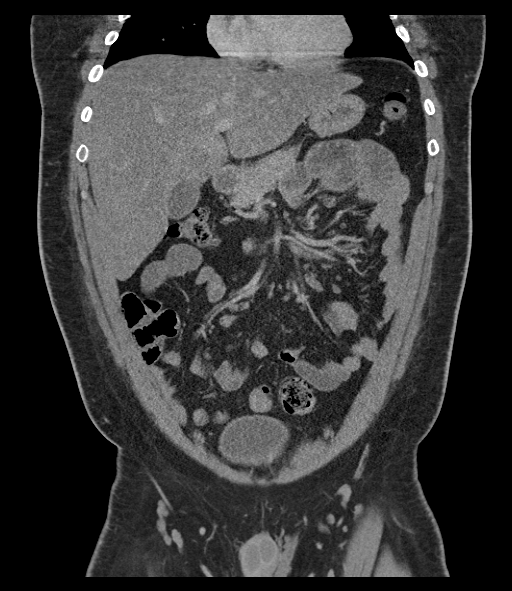
[im 48/108  soft-tissue]
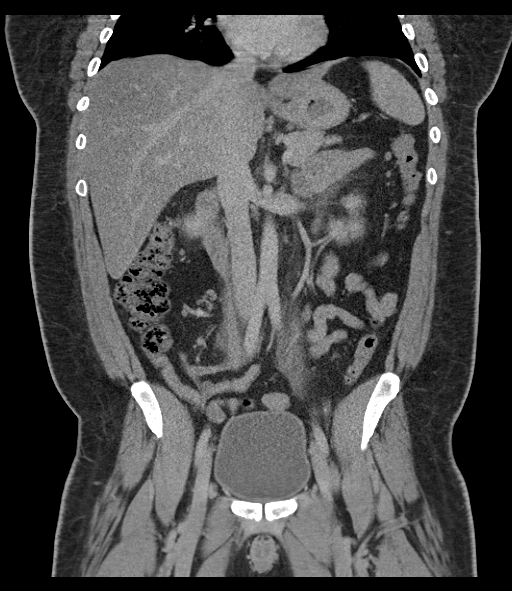
[im 60/108  soft-tissue]
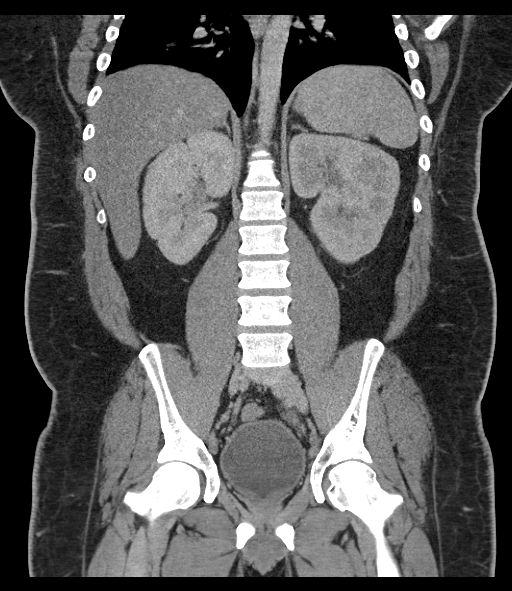

[16 of 46 positions shown; findings below may reference images not displayed]

FINDINGS: Lung bases are clear.

Diffuse fatty infiltration of the liver. No focal liver lesions
identified. The gallbladder, spleen, pancreas, adrenal glands,
abdominal aorta, inferior vena cava, and retroperitoneal lymph nodes
are unremarkable.

Stomach and small bowel are decompressed. Scattered stool in the
colon without abnormal distention. No free air or free fluid in the
abdomen.

The left renal nephrogram is slightly delayed in comparison to the
right. There is mild stranding around the left kidney and ureter. No
significant hydronephrosis or hydroureter but there is evidence of
wall thickening in the ureter and stranding diffusely around the
ureter, likely indicating pyelonephritis. There is a calcification
at the location of the distal left ureter which is not definitively
within the ureter. This could represent adjacent phlebolith or a
distal ureteral stone. The calcification measures 4 mm.

Pelvis: The appendix contains a small appendicolith but is otherwise
normal. There is no evidence of appendiceal dilatation or
inflammation. Bladder wall is not significantly thickened. Prostate
gland is not enlarged. No pelvic mass or lymphadenopathy. No
destructive bone lesions.
IMPRESSION: Delayed nephrogram on the left with stranding around the left ureter
and ureteral wall thickening. Possible stone in the distal left
ureter. Changes may represent either or combination of distal
ureteral stone and/or pyelonephritis.

Appendicolith without evidence of appendicitis. Diffuse fatty
infiltration of the liver.

## 2020-01-20 DEATH — deceased
